# Patient Record
Sex: Female | Born: 1980 | Race: Black or African American | Hispanic: No | Marital: Single | Smoking: Never smoker
Health system: Southern US, Community
[De-identification: ages and names within clinical notes are randomized; demographics above are authoritative.]

## PROBLEM LIST (undated history)

## (undated) DIAGNOSIS — A749 Chlamydial infection, unspecified: Secondary | ICD-10-CM

## (undated) DIAGNOSIS — I429 Cardiomyopathy, unspecified: Secondary | ICD-10-CM

## (undated) HISTORY — DX: Chlamydial infection, unspecified: A74.9

## (undated) HISTORY — PX: OTHER SURGICAL HISTORY: SHX169

---

## 2003-08-13 ENCOUNTER — Other Ambulatory Visit: Admission: RE | Admit: 2003-08-13 | Discharge: 2003-08-13 | Payer: Self-pay | Admitting: Obstetrics and Gynecology

## 2004-08-21 ENCOUNTER — Other Ambulatory Visit: Admission: RE | Admit: 2004-08-21 | Discharge: 2004-08-21 | Payer: Self-pay | Admitting: Obstetrics and Gynecology

## 2005-06-19 ENCOUNTER — Inpatient Hospital Stay (HOSPITAL_COMMUNITY): Admission: AD | Admit: 2005-06-19 | Discharge: 2005-06-21 | Payer: Self-pay | Admitting: Obstetrics and Gynecology

## 2005-06-22 ENCOUNTER — Encounter: Admission: RE | Admit: 2005-06-22 | Discharge: 2005-07-22 | Payer: Self-pay | Admitting: Obstetrics and Gynecology

## 2005-08-31 ENCOUNTER — Other Ambulatory Visit: Admission: RE | Admit: 2005-08-31 | Discharge: 2005-08-31 | Payer: Self-pay | Admitting: Obstetrics and Gynecology

## 2006-07-30 ENCOUNTER — Emergency Department (HOSPITAL_COMMUNITY): Admission: EM | Admit: 2006-07-30 | Discharge: 2006-07-30 | Payer: Self-pay | Admitting: Emergency Medicine

## 2010-05-05 LAB — RPR: RPR: NONREACTIVE

## 2010-05-05 LAB — RUBELLA ANTIBODY, IGM: Rubella: IMMUNE

## 2010-05-30 NOTE — H&P (Signed)
[name], [name]       ACCOUNT NO.:  [id number]   MEDICAL RECORD NO.:  [id number]          PATIENT TYPE:  MAT   LOCATION:  [facility]                          FACILITY:  [facility]   PHYSICIAN:  Crist Fat. Rivard, M.D. DATE OF BIRTH:  [birth year]   DATE OF ADMISSION:  06/19/2005  DATE OF DISCHARGE:                                HISTORY & PHYSICAL   HISTORY OF PRESENT ILLNESS:  Ms. [name] is a 30 year old gravida 1,  para 0 at 38-3/7 weeks, who presented with gradually increasing contractions  since approximately 8 p.m.  She denied any leaking or bleeding and reported  positive fetal movement.  She was seen in the office on June 18, 2005 with  cervix 2 cm, 80%, vertex at a -1.  Membranes were swept at that time.  Pregnancy has been remarkable for:  #1 - Conception on oral contraceptives,  #2 - asthma, #3 - bilateral choroid plexus cysts on 18-week ultrasound; the  patient is unsure if they resolved.   PRENATAL LABORATORY DATA:  Blood type is B positive, Rh-antibody negative.  VDRL nonreactive.  Rubella titer positive.  Hepatitis B surface antigen  negative.  HIV nonreactive.  Sickle cell test was negative.  GC and  Chlamydia cultures were declined at the first visit.  Group B strep culture  was negative at 36 weeks.  Hemoglobin upon entry into the practice was 13.2;  it was 10.8 at 26 weeks.  Quadruple screen was normal.  Glucola was normal.  RPR was nonreactive at 27 weeks.  EDC of July 02, 2005 was established by  last menstrual period and was in agreement with ultrasound at approximately  7 and 18 weeks.   HISTORY OF PRESENT PREGNANCY:  The patient entered care at approximately 7  weeks.  She had an ultrasound for dating secondary to conception on OCPs.  She had another ultrasound at 14 weeks showing normal growth and development  and a normal nuchal translucency.  She had another ultrasound at 18 weeks  for anatomy; this documented normal cervix and anterior placenta.   There  were bilateral choroid plexus cysts noted.  The patient declined  amniocentesis, but desire quadruple screen; this was done and was normal.  She had a normal Glucola.  Hemoglobin was 10.8 at 26 weeks.  She had had  some low weight gain, and so she had another ultrasound in April that showed  normal growth and development.  The patient is unsure if the choroid plexus  cysts were still present.  The rest of her pregnancy was uncomplicated.   OBSTETRICAL HISTORY:  The patient is a primigravida.   MEDICAL HISTORY:  She was on Ortho Tri-Cyclen and did conceive on this.  She  has occasional yeast infection.  She reports the usual childhood illnesses.  She does have a history of asthma for which she uses a p.r.n. albuterol  inhaler, but has had no attacks.   PAST SURGICAL HISTORY:  Surgical history includes a cyst removed from her  right ear in 2000.  She did have some difficulty going to sleep during that  surgery.   ALLERGIES:  The patient has no known  medication allergies.   FAMILY HISTORY:  Her maternal grandmother had a heart attack.  Her maternal  grandmother was and brother is hypertensive, on medication.  Her mother had  leukemia.  Maternal grandmother was diabetic, on insulin, and is now  deceased.  Paternal grandmother had breast cancer and is now deceased.  Her  paternal grandfather and maternal grandfather did use alcohol.   GENETIC HISTORY:  Genetic history is remarkable for the father of the baby  born with 2 left middle toes connected.  The patient's maternal first cousin  had a hole in the heart.  Father of the baby's mother had sickle cell trait.  Father of the baby's maternal aunt had sickle cell disease.  The patient's  brother and sister are twins.   SOCIAL HISTORY:  The patient is married to the father of the baby; he is  involved and supportive; his name is Interior and spatial designer.  The patient is [place]  American; she is of the [employer] faith.  The patient  has a college  education; she is a Runner, broadcasting/film/video.  Her husband is also college-educated; he is a  Geophysical data processor.  She has been followed by the physicians' service  at [facility].  She denies any alcohol, drug or tobacco use during  this pregnancy.   PHYSICAL EXAMINATION:  VITAL SIGNS:  Stable.  The patient is afebrile.  HEENT:  Within normal limits.  LUNGS:  Bilateral breath sounds are clear.  HEART:  Regular rate and rhythm without murmur.  BREASTS:  Soft and nontender.  ABDOMEN:  Fundal height is approximately 37 cm.  Estimated fetal weight is 6-  1/2 to 7 pounds.  Uterine contractions are every 4-5 minutes, moderate-to-  strong quality.  Fetal heart rate is reassuring, but there are occasional  mild variables noted.  PELVIC:  Cervix is 3 cm, 80%, vertex at a -1 with bulging bag of water.  EXTREMITIES:  Deep tendon reflexes are 2+ without clonus.  There is a trace  edema noted.   IMPRESSION:  1.  Intrauterine pregnancy at 38-3/7 weeks.  2.  Early labor.  3.  Negative group B streptococcus.   PLAN:  1.  Admit to birthing suite per consult with Dr. Estanislado Pandy as attending      physician.  2.  Routine physician orders.  3.  The patient desires epidural as labor progresses.      [name], C.N.M.      Crist Fat Rivard, M.D.  Electronically Signed    VLL/MEDQ  D:  06/19/2005  T:  06/19/2005  Job:  [id number]

## 2010-06-02 LAB — ABO/RH: RH Type: POSITIVE

## 2010-06-02 LAB — RPR: RPR: NONREACTIVE

## 2010-06-02 LAB — HEPATITIS B SURFACE ANTIGEN: Hepatitis B Surface Ag: NEGATIVE

## 2010-10-30 LAB — STREP B DNA PROBE: GBS: NEGATIVE

## 2010-11-15 ENCOUNTER — Inpatient Hospital Stay (HOSPITAL_COMMUNITY)
Admission: AD | Admit: 2010-11-15 | Discharge: 2010-11-15 | Disposition: A | Discharge status: Home / Self Care | Payer: BC Managed Care – PPO | Source: Ambulatory Visit | Attending: Obstetrics and Gynecology | Admitting: Obstetrics and Gynecology

## 2010-11-15 ENCOUNTER — Encounter (HOSPITAL_COMMUNITY): Payer: Self-pay | Admitting: *Deleted

## 2010-11-15 DIAGNOSIS — O471 False labor at or after 37 completed weeks of gestation: Secondary | ICD-10-CM

## 2010-11-15 NOTE — ED Provider Notes (Signed)
History     Chief Complaint  Patient presents with  . Labor Eval   HPI 30yo G2 P1 who presents at 74w 3d gestation with c/o uterine contractions with increased frequency and intensity.  Reports onset at 3:30 AM 11/15/10.  Denies ROM or bldg.  Reports active fetus.  Reports UCs approx every since 5:30am and unable to walk and talk through.  States she has lost her mucus plug this AM.    Pregnancy remarkable for: Followed by MD service of [facility] 1st trim chlamydia and BV with neg TOC Hx prev pyelonephritis x 1 prior to preg.  OB History    Grav Para Term Preterm Abortions TAB SAB Ect Mult Living   2 1 1       1       Past Medical History  Diagnosis Date  . Asthma     Past Surgical History  Procedure Date  . Cyst removal rt ear     Family History  Problem Relation Age of Onset  . Hypertension Mother   . Cancer Mother   . Hypertension Brother   . Heart disease Maternal Grandmother   . Hypertension Maternal Grandmother   . Cancer Paternal Grandmother   . Diabetes Paternal Grandmother     History  Substance Use Topics  . Smoking status: Never Smoker   . Smokeless tobacco: Not on file  . Alcohol Use: No    Allergies: No Known Allergies  Prescriptions prior to admission  Medication Sig Dispense Refill  . albuterol (PROVENTIL HFA) 108 (90 BASE) MCG/ACT inhaler Inhale 2 puffs into the lungs every 6 (six) hours as needed.        . Prenatal Vit-Fe Fumarate-FA (PRENATAL MULTIVITAMIN) 60-1 MG tablet Take 1 tablet by mouth daily.          Review of Systems  Constitutional: Negative.   HENT: Negative.   Eyes: Negative.   Respiratory: Negative.   Cardiovascular: Negative.   Gastrointestinal: Negative.   Genitourinary: Negative.   Musculoskeletal: Negative.   Skin: Negative.   Neurological: Negative.   Endo/Heme/Allergies: Negative.   Psychiatric/Behavioral: Negative.    Physical Exam   Blood pressure 113/73, pulse 80, temperature 98 F (36.7 C), temperature  source Oral, resp. rate 18, height 5\' 6"  (1.676 m), weight 91.899 kg (202 lb 9.6 oz), last menstrual period 02/18/2010.  Physical Exam  Constitutional: She is oriented to person, place, and time. She appears well-developed and well-nourished.  HENT:  Head: Normocephalic and atraumatic.  Right Ear: External ear normal.  Left Ear: External ear normal.  Nose: Nose normal.  Eyes: Conjunctivae are normal. Pupils are equal, round, and reactive to light.  Neck: Normal range of motion. Neck supple. No thyromegaly present.  Cardiovascular: Normal rate, regular rhythm and intact distal pulses.   Respiratory: Effort normal and breath sounds normal.  GI: Soft. Bowel sounds are normal.       Gravid.  Fundal height 38cms.  Vtx to leopolds.  EFW 6.5#.  Genitourinary: Vagina normal and uterus normal.       SVE:  3cm/70%/-2/vtx.  Sm amt of mucus present.  Musculoskeletal: Normal range of motion.  Neurological: She is alert and oriented to person, place, and time. She has normal reflexes.  Skin: Skin is warm and dry.  Psychiatric: She has a normal mood and affect. Her behavior is normal. Judgment and thought content normal.   FHR 135 baseline with moderate variability present.  Accels present.  No decels present.  FHR Cat 1  Toco:  UCs every 5-6 minutes and mod to palpation.  Pt talking through UCs. Marland Kitchen MAU Course  Procedures   Assessment and Plan  IUP at 38w 3d Uterine contractions  Pt to ambulate and will re-eval uterine contraction pattern and SVE after ambulation.  Kean Gautreau O. 11/15/10, 8:38 AM   S:  Pt returned from ambulation.  Reports since returning from walking, UCs are less frequent and less intense than earlier.  Denies ROM or bldg.  Reports normal fetal movement.  O: Pt smiling and laughing.  No change in PE.      FHR: Baseline 140.  Moderate variability present.  Accels present.  No decels present.  FHR Cat 1.      Toco:  UCs every 5-7 minutes and mild to palpation.      SVE:   3cm/70%/-2/vtx - unchanged from previous exam.     A:  IUP at 38w 3d      False labor.  P:  Pt d/ced to home after consult with Dr. Stefano Gaul.      Rec pt try OTC benadryl for therapeutic rest and increase fluids.      Rev'd signs/symptoms of labor and fetal kick counts.      RTO at [facility] on Thursday, November 20, 2010 as previously scheduled.  Elsie Ra, CNM 11/15/10 10:50 AM

## 2010-11-15 NOTE — ED Notes (Signed)
efm strip reviewed by Elsie Ra CNM, patient will walk for an hour and then be rechecked, plan of care discussed with patient and her significant other by Elsie Ra CNM

## 2010-11-15 NOTE — Progress Notes (Signed)
Pt reports having ctx  sonce 3am about 6 min apart. Reports mucusy discharge and deneis vag bleeding. eports good fetal movement.

## 2010-11-18 ENCOUNTER — Other Ambulatory Visit: Payer: Self-pay | Admitting: Obstetrics and Gynecology

## 2010-11-18 ENCOUNTER — Inpatient Hospital Stay (HOSPITAL_COMMUNITY): Payer: BC Managed Care – PPO | Admitting: Anesthesiology

## 2010-11-18 ENCOUNTER — Encounter (HOSPITAL_COMMUNITY): Payer: Self-pay | Admitting: Anesthesiology

## 2010-11-18 ENCOUNTER — Inpatient Hospital Stay (HOSPITAL_COMMUNITY)
Admission: AD | Admit: 2010-11-18 | Discharge: 2010-11-20 | DRG: 373 | Disposition: A | Discharge status: Home / Self Care | Payer: BC Managed Care – PPO | Source: Ambulatory Visit | Attending: Obstetrics and Gynecology | Admitting: Obstetrics and Gynecology

## 2010-11-18 ENCOUNTER — Encounter (HOSPITAL_COMMUNITY): Payer: Self-pay | Admitting: *Deleted

## 2010-11-18 DIAGNOSIS — IMO0001 Reserved for inherently not codable concepts without codable children: Secondary | ICD-10-CM

## 2010-11-18 DIAGNOSIS — Z3490 Encounter for supervision of normal pregnancy, unspecified, unspecified trimester: Secondary | ICD-10-CM

## 2010-11-18 DIAGNOSIS — J45909 Unspecified asthma, uncomplicated: Secondary | ICD-10-CM | POA: Diagnosis present

## 2010-11-18 LAB — CBC
HCT: 38.6 % (ref 36.0–46.0)
MCHC: 33.2 g/dL (ref 30.0–36.0)
MCV: 94.8 fL (ref 78.0–100.0)
Platelets: 294 10*3/uL (ref 150–400)
RDW: 12.7 % (ref 11.5–15.5)
WBC: 10.5 10*3/uL (ref 4.0–10.5)

## 2010-11-18 MED ORDER — PHENYLEPHRINE 40 MCG/ML (10ML) SYRINGE FOR IV PUSH (FOR BLOOD PRESSURE SUPPORT)
80.0000 ug | PREFILLED_SYRINGE | INTRAVENOUS | Status: DC | PRN
  Filled 2010-11-18: qty 5

## 2010-11-18 MED ORDER — LIDOCAINE HCL (PF) 1 % IJ SOLN
30.0000 mL | INTRAMUSCULAR | Status: DC | PRN
  Filled 2010-11-18: qty 30

## 2010-11-18 MED ORDER — FLEET ENEMA 7-19 GM/118ML RE ENEM
1.0000 | ENEMA | RECTAL | Status: DC | PRN
Start: 2010-11-18 — End: 2010-11-20

## 2010-11-18 MED ORDER — BISACODYL 10 MG RE SUPP: 10.0000 mg | Freq: Every day | RECTAL | Status: DC | PRN

## 2010-11-18 MED ORDER — ONDANSETRON HCL 4 MG/2ML IJ SOLN: 4.0000 mg | INTRAMUSCULAR | Status: DC | PRN

## 2010-11-18 MED ORDER — EPHEDRINE 5 MG/ML INJ
10.0000 mg | INTRAVENOUS | Status: DC | PRN
  Filled 2010-11-18: qty 4

## 2010-11-18 MED ORDER — ALBUTEROL SULFATE HFA 108 (90 BASE) MCG/ACT IN AERS
2.0000 | INHALATION_SPRAY | Freq: Four times a day (QID) | RESPIRATORY_TRACT | Status: DC | PRN
  Filled 2010-11-18: qty 6.7

## 2010-11-18 MED ORDER — TETANUS-DIPHTH-ACELL PERTUSSIS 5-2.5-18.5 LF-MCG/0.5 IM SUSP: 0.5000 mL | Freq: Once | INTRAMUSCULAR | Status: DC

## 2010-11-18 MED ORDER — DIBUCAINE 1 % RE OINT: 1.0000 "application " | TOPICAL_OINTMENT | RECTAL | Status: DC | PRN

## 2010-11-18 MED ORDER — DIPHENHYDRAMINE HCL 25 MG PO CAPS: 25.0000 mg | ORAL_CAPSULE | Freq: Four times a day (QID) | ORAL | Status: DC | PRN

## 2010-11-18 MED ORDER — IBUPROFEN 600 MG PO TABS
600.0000 mg | ORAL_TABLET | Freq: Four times a day (QID) | ORAL | Status: DC
  Administered 2010-11-18 – 2010-11-20 (×7): 600 mg via ORAL
  Filled 2010-11-18 (×7): qty 1

## 2010-11-18 MED ORDER — LACTATED RINGERS IV SOLN: 500.0000 mL | INTRAVENOUS | Status: DC | PRN

## 2010-11-18 MED ORDER — ACETAMINOPHEN 325 MG PO TABS: 650.0000 mg | ORAL_TABLET | ORAL | Status: DC | PRN

## 2010-11-18 MED ORDER — EPHEDRINE 5 MG/ML INJ: 10.0000 mg | INTRAVENOUS | Status: DC | PRN

## 2010-11-18 MED ORDER — BENZOCAINE-MENTHOL 20-0.5 % EX AERO
1.0000 "application " | INHALATION_SPRAY | CUTANEOUS | Status: DC | PRN
  Administered 2010-11-18: 1 via TOPICAL

## 2010-11-18 MED ORDER — FLEET ENEMA 7-19 GM/118ML RE ENEM: 1.0000 | ENEMA | RECTAL | Status: DC | PRN

## 2010-11-18 MED ORDER — ONDANSETRON HCL 4 MG/2ML IJ SOLN: 4.0000 mg | Freq: Four times a day (QID) | INTRAMUSCULAR | Status: DC | PRN

## 2010-11-18 MED ORDER — SENNOSIDES-DOCUSATE SODIUM 8.6-50 MG PO TABS
2.0000 | ORAL_TABLET | Freq: Every day | ORAL | Status: DC
  Administered 2010-11-18 – 2010-11-19 (×2): 2 via ORAL

## 2010-11-18 MED ORDER — OXYTOCIN 20 UNITS IN LACTATED RINGERS INFUSION - SIMPLE
125.0000 mL/h | Freq: Once | INTRAVENOUS | Status: AC
  Administered 2010-11-18: 999 mL/h via INTRAVENOUS

## 2010-11-18 MED ORDER — LANOLIN HYDROUS EX OINT: TOPICAL_OINTMENT | CUTANEOUS | Status: DC | PRN

## 2010-11-18 MED ORDER — FENTANYL 2.5 MCG/ML BUPIVACAINE 1/10 % EPIDURAL INFUSION (WH - ANES)
14.0000 mL/h | INTRAMUSCULAR | Status: DC
  Administered 2010-11-18 (×2): 14 mL/h via EPIDURAL
  Filled 2010-11-18 (×2): qty 60

## 2010-11-18 MED ORDER — LACTATED RINGERS IV SOLN
INTRAVENOUS | Status: DC
  Administered 2010-11-18: 01:00:00 via INTRAVENOUS

## 2010-11-18 MED ORDER — LACTATED RINGERS IV SOLN: INTRAVENOUS | Status: DC

## 2010-11-18 MED ORDER — TERBUTALINE SULFATE 1 MG/ML IJ SOLN: 0.2500 mg | Freq: Once | INTRAMUSCULAR | Status: DC | PRN

## 2010-11-18 MED ORDER — BUTORPHANOL TARTRATE 2 MG/ML IJ SOLN: 1.0000 mg | INTRAMUSCULAR | Status: DC | PRN

## 2010-11-18 MED ORDER — HYDROXYZINE HCL 50 MG PO TABS: 50.0000 mg | ORAL_TABLET | Freq: Four times a day (QID) | ORAL | Status: DC | PRN

## 2010-11-18 MED ORDER — OXYCODONE-ACETAMINOPHEN 5-325 MG PO TABS
2.0000 | ORAL_TABLET | ORAL | Status: DC | PRN
  Administered 2010-11-18: 2 via ORAL
  Filled 2010-11-18: qty 2

## 2010-11-18 MED ORDER — ONDANSETRON HCL 4 MG PO TABS: 4.0000 mg | ORAL_TABLET | ORAL | Status: DC | PRN

## 2010-11-18 MED ORDER — OXYCODONE-ACETAMINOPHEN 5-325 MG PO TABS
1.0000 | ORAL_TABLET | ORAL | Status: DC | PRN
  Administered 2010-11-18 – 2010-11-20 (×8): 2 via ORAL
  Filled 2010-11-18 (×2): qty 2
  Filled 2010-11-18: qty 1
  Filled 2010-11-18 (×5): qty 2
  Filled 2010-11-18: qty 1

## 2010-11-18 MED ORDER — LACTATED RINGERS IV SOLN: 500.0000 mL | Freq: Once | INTRAVENOUS | Status: DC

## 2010-11-18 MED ORDER — PRENATAL PLUS 27-1 MG PO TABS
1.0000 | ORAL_TABLET | Freq: Every day | ORAL | Status: DC
  Administered 2010-11-18 – 2010-11-20 (×3): 1 via ORAL
  Filled 2010-11-18 (×3): qty 1

## 2010-11-18 MED ORDER — PHENYLEPHRINE 40 MCG/ML (10ML) SYRINGE FOR IV PUSH (FOR BLOOD PRESSURE SUPPORT): 80.0000 ug | PREFILLED_SYRINGE | INTRAVENOUS | Status: DC | PRN

## 2010-11-18 MED ORDER — MEDROXYPROGESTERONE ACETATE 150 MG/ML IM SUSP: 150.0000 mg | INTRAMUSCULAR | Status: DC | PRN

## 2010-11-18 MED ORDER — LIDOCAINE HCL 1.5 % IJ SOLN
INTRAMUSCULAR | Status: DC | PRN
  Administered 2010-11-18 (×2): 5 mL via EPIDURAL

## 2010-11-18 MED ORDER — CITRIC ACID-SODIUM CITRATE 334-500 MG/5ML PO SOLN: 30.0000 mL | ORAL | Status: DC | PRN

## 2010-11-18 MED ORDER — ZOLPIDEM TARTRATE 5 MG PO TABS: 5.0000 mg | ORAL_TABLET | Freq: Every evening | ORAL | Status: DC | PRN

## 2010-11-18 MED ORDER — OXYTOCIN BOLUS FROM INFUSION
500.0000 mL | Freq: Once | INTRAVENOUS | Status: DC
  Filled 2010-11-18: qty 500
  Filled 2010-11-18: qty 1000

## 2010-11-18 MED ORDER — SIMETHICONE 80 MG PO CHEW: 80.0000 mg | CHEWABLE_TABLET | ORAL | Status: DC | PRN

## 2010-11-18 MED ORDER — FERROUS SULFATE 325 (65 FE) MG PO TABS
325.0000 mg | ORAL_TABLET | Freq: Two times a day (BID) | ORAL | Status: DC
  Administered 2010-11-18 – 2010-11-19 (×2): 325 mg via ORAL
  Filled 2010-11-18 (×4): qty 1

## 2010-11-18 MED ORDER — HYDROXYZINE HCL 50 MG/ML IM SOLN: 50.0000 mg | Freq: Four times a day (QID) | INTRAMUSCULAR | Status: DC | PRN

## 2010-11-18 MED ORDER — BENZOCAINE-MENTHOL 20-0.5 % EX AERO
INHALATION_SPRAY | CUTANEOUS | Status: AC
  Administered 2010-11-18: 1 via TOPICAL
  Filled 2010-11-18: qty 56

## 2010-11-18 MED ORDER — DIPHENHYDRAMINE HCL 50 MG/ML IJ SOLN: 12.5000 mg | INTRAMUSCULAR | Status: DC | PRN

## 2010-11-18 MED ORDER — WITCH HAZEL-GLYCERIN EX PADS: 1.0000 "application " | MEDICATED_PAD | CUTANEOUS | Status: DC | PRN

## 2010-11-18 MED ORDER — OXYTOCIN 20 UNITS IN LACTATED RINGERS INFUSION - SIMPLE: 1.0000 m[IU]/min | INTRAVENOUS | Status: DC

## 2010-11-18 MED ORDER — IBUPROFEN 600 MG PO TABS
600.0000 mg | ORAL_TABLET | Freq: Four times a day (QID) | ORAL | Status: DC | PRN
  Administered 2010-11-18: 600 mg via ORAL
  Filled 2010-11-18: qty 1

## 2010-11-18 NOTE — Anesthesia Preprocedure Evaluation (Signed)
Anesthesia Evaluation  Patient identified by MRN, date of birth, ID band Patient awake    Reviewed: Allergy & Precautions, H&P , Patient's Chart, lab work & pertinent test results  Airway Mallampati: II TM Distance: >3 FB Neck ROM: full    Dental No notable dental hx.    Pulmonary neg pulmonary ROS, asthma ,  clear to auscultation  Pulmonary exam normal       Cardiovascular neg cardio ROS regular Normal    Neuro/Psych Negative Neurological ROS  Negative Psych ROS   GI/Hepatic negative GI ROS, Neg liver ROS,   Endo/Other  Negative Endocrine ROS  Renal/GU negative Renal ROS     Musculoskeletal   Abdominal   Peds  Hematology negative hematology ROS (+)   Anesthesia Other Findings   Reproductive/Obstetrics (+) Pregnancy                           Anesthesia Physical Anesthesia Plan  ASA: II  Anesthesia Plan: Epidural   Post-op Pain Management:    Induction:   Airway Management Planned:   Additional Equipment:   Intra-op Plan:   Post-operative Plan:   Informed Consent: I have reviewed the patients History and Physical, chart, labs and discussed the procedure including the risks, benefits and alternatives for the proposed anesthesia with the patient or authorized representative who has indicated his/her understanding and acceptance.     Plan Discussed with:   Anesthesia Plan Comments:         Anesthesia Quick Evaluation  

## 2010-11-18 NOTE — Progress Notes (Signed)
Delivery Note At 10:32 AM a viable female was delivered via Vaginal, Spontaneous Delivery (Presentation: Left Occiput Anterior).  APGAR: 9, 9; weight 7 lb 3 oz (3260 g).   Placenta status: Abnormal, Manual removal.  Cord: 3 vessels with the following complications: None.  Cord pH: na  Anesthesia: Epidural  Episiotomy: None Lacerations: None Suture Repair: na Est. Blood Loss (mL): 500  Mom to postpartum.  Baby to nursery-stable.  Rook Maue P 11/18/2010, 11:14 AM

## 2010-11-18 NOTE — Progress Notes (Signed)
FHR decreased to 80-90 bpm for 2 minutes then gradually returned to baseline.  Dr. Pennie Rushing notified.

## 2010-11-18 NOTE — Progress Notes (Signed)
Contractions x 4 hours. Denies bleeding or ROM

## 2010-11-18 NOTE — Anesthesia Procedure Notes (Signed)
Epidural Patient location during procedure: OB Start time: 11/18/2010 1:59 AM  Staffing Performed by: anesthesiologist   Preanesthetic Checklist Completed: patient identified, site marked, surgical consent, pre-op evaluation, timeout performed, IV checked, risks and benefits discussed and monitors and equipment checked  Epidural Patient position: sitting Prep: site prepped and draped and DuraPrep Patient monitoring: continuous pulse ox and blood pressure Approach: midline Injection technique: LOR air and LOR saline  Needle:  Needle type: Tuohy  Needle gauge: 17 G Needle length: 9 cm Needle insertion depth: 6 cm Catheter type: closed end flexible Catheter size: 19 Gauge Catheter at skin depth: 11 cm Test dose: negative  Assessment Events: blood not aspirated, injection not painful, no injection resistance, negative IV test and no paresthesia  Additional Notes Patient identified.  Risk benefits discussed including failed block, incomplete pain control, headache, nerve damage, paralysis, blood pressure changes, nausea, vomiting, reactions to medication both toxic or allergic, and postpartum back pain.  Patient expressed understanding and wished to proceed.  All questions were answered.  Sterile technique used throughout procedure and epidural site dressed with sterile barrier dressing. No paresthesia or other complications noted.The patient did not experience any signs of intravascular injection such as tinnitus or metallic taste in mouth nor signs of intrathecal spread such as rapid motor block. Please see nursing notes for vital signs.

## 2010-11-18 NOTE — Progress Notes (Signed)
[name] is a 30 y.o. G2P1001 at [redacted]w[redacted]d by LMP admitted for active labor  Subjective:  Comfortable with epidural now.   Objective: BP 123/63  Pulse 91  Temp(Src) 98.1 F (36.7 C) (Oral)  Resp 18  Ht 5\' 6"  (1.676 m)  Wt 207 lb (93.895 kg)  BMI 33.41 kg/m2  SpO2 80%  LMP 02/18/2010      FHT:  FHR: 145 bpm, variability: minimal ,  accelerations:  Present,  decelerations:  Present single episode at about 818 am with FHR decel to the 80's  for 2 mins after position changed.  Position changed again with resolution fo the decel and none since UC:   irregular, every 3-5 minutes SVE:   cx by my exam 6cm/90%/-2  Labs: Lab Results  Component Value Date   WBC 10.5 11/18/2010   HGB 12.8 11/18/2010   HCT 38.6 11/18/2010   MCV 94.8 11/18/2010   PLT 294 11/18/2010    Assessment / Plan: Arrest in active phase of labor  Labor: contractions more irregular after epidural.   AROM with clear fluid Preeclampsia:  no signs or symptoms of toxicity Fetal Wellbeing:  Category II Pain Control:  Epidural I/D:  n/a Anticipated MOD:  NSVD and will start pitocin if no cervical progress in one hour  Gregory,Leah P 11/18/2010, 8:50 AM

## 2010-11-18 NOTE — H&P (Signed)
[name] is a 30 y.o. black female G2P1001 presenting at 38.6 weeks per [facility] 11/26/10 for labor check with contractions increasing in frequency and intensity since 2000. Had tried warm bath without relief and was unable to sleep.  Denies any LOF, VB, UTI or PIH s/s.  GFM.  3cm in MAU on Saturday, but ctxs spaced and d/c'd home.  Followed by MD service at [facility]; onset of prenatal care at 10.5 weeks.  Declined aneuploidy screening.  First pregnancy was SVD in 2007, female "Micah," weighing 5lb 10oz at 38 weeks.  Expecting a boy this pregnancy.   Maternal Medical History:  Reason for admission: Reason for admission: contractions.  Contractions: Onset was 3-5 hours ago.   Frequency: regular.   Perceived severity is moderate.    Fetal activity: Perceived fetal activity is normal.   Last perceived fetal movement was within the past hour.    Prenatal complications: 1.  1st trimester chlamydia and BV 2.  Asthma 3.  H/o pyelonephritis in the past 4.  Different FOB this pregnancy    OB History    Grav Para Term Preterm Abortions TAB SAB Ect Mult Living   2 1 1       1      Past Medical History  Diagnosis Date  . Asthma    Past Surgical History  Procedure Date  . Cyst removal rt ear    Family History: family history includes Cancer in her mother and paternal grandmother; Diabetes in her paternal grandmother; Heart disease in her maternal grandmother; and Hypertension in her brother, maternal grandmother, and mother. Social History:  reports that she has never smoked. She does not have any smokeless tobacco history on file. She reports that she does not drink alcohol or use illicit drugs.  Review of Systems  Constitutional: Negative.   Eyes: Negative.   Respiratory: Negative.   Cardiovascular: Negative.   Gastrointestinal: Negative.        2 BM's today  Genitourinary: Negative.   Skin: Negative.   Neurological: Negative.     Dilation: 4.5 Effacement (%): 80 Exam by::  Denvil Canning,.CNM Blood pressure 122/67, pulse 79, temperature 98.5 F (36.9 C), resp. rate 20, height 5\' 6"  (1.676 m), weight 93.895 kg (207 lb), last menstrual period 02/18/2010, SpO2 99.00%. Maternal Exam:  Uterine Assessment: Contraction strength is moderate.  Contraction frequency is regular.  UC's q 2-5  Abdomen: Patient reports no abdominal tenderness. Estimated fetal weight is 6-7 lbs.   Fetal presentation: vertex  Introitus: Normal vulva. Pelvis: adequate for delivery.   Cervix: Cervix evaluated by digital exam.     Fetal Exam Fetal Monitor Review: Mode: ultrasound.   Baseline rate: 140.  Variability: moderate (6-25 bpm).   Pattern: no accelerations and no decelerations.    Fetal State Assessment: Category I - tracings are normal.     Physical Exam  Constitutional: She is oriented to person, place, and time. She appears well-developed and well-nourished. No distress.  Cardiovascular: Normal rate and regular rhythm.   Respiratory: Effort normal and breath sounds normal.  GI: Soft. Bowel sounds are normal.       gravid  Genitourinary:       Cx: 5/80/-1 posterior to mid position, BBOW,   Musculoskeletal: She exhibits edema.       Trace to mild BLE generalized edema  Neurological: She is alert and oriented to person, place, and time. She has normal reflexes.  Skin: Skin is warm and dry.  Psychiatric: She has a normal mood  and affect. Her behavior is normal. Judgment and thought content normal.    Prenatal labs: ABO, Rh: B/Positive/-- (05/21 0000) Antibody: Negative (05/21 0000) Rubella: Immune (04/23 0000) RPR: Nonreactive (05/21 0000)  HBsAg: Negative (05/21 0000)  HIV: Non-reactive (05/21 0000)  GBS: Negative (10/18 0000)  1hr gtt=131  Assessment/Plan: 1.  IUP at 38.6 weeks 2.  Active labor 3.  GBS negative 4.  Desires epidural 5.  Planning to BF and circumcision 6.  Cat 1 FHT; nonreactive, but negative spontaneous CST 7.  Stable asthma  1.  Admit to BS  with Dr. Estanislado Pandy as attending 2.  Routine L&D orders 3.  Epidural ASAP 4.  AROM and/or Pitocin prn augmentation 5.  MD to follow   Quantarius Genrich H 11/18/2010, 1:07 AM

## 2010-11-18 NOTE — Progress Notes (Signed)
Dr. Pennie Rushing notified of pt status, SVE, FHR, deceleration noted, RN interventions, and UC pattern.  Will continue to monitor.

## 2010-11-19 LAB — CBC
Hemoglobin: 10.9 g/dL — ABNORMAL LOW (ref 12.0–15.0)
MCH: 31 pg (ref 26.0–34.0)
RBC: 3.52 MIL/uL — ABNORMAL LOW (ref 3.87–5.11)
WBC: 10.3 10*3/uL (ref 4.0–10.5)

## 2010-11-19 NOTE — Anesthesia Postprocedure Evaluation (Signed)
Anesthesia Post Note  Patient: [name]  Procedure(s) Performed: * No procedures listed *  Anesthesia type: Epidural  Patient location: Mother/Baby  Post pain: Pain level controlled  Post assessment: Post-op Vital signs reviewed  Last Vitals:  Filed Vitals:   11/19/10 2132  BP: 106/65  Pulse: 89  Temp: 36.7 C  Resp: 18    Post vital signs: Reviewed  Level of consciousness: awake  Complications: No apparent anesthesia complications

## 2010-11-19 NOTE — Progress Notes (Addendum)
  Post Partum Day 1 Subjective: C/o nausea currently, had taken percocet, PNV and Fe pill simultaneously, offered zofran, pt declined at this time, up ad lib without syncope, voiding, tolerating PO, + flatus  Pain well controlled with po meds BF started Mood stable, bonding well   Objective: Blood pressure 112/77, pulse 74, temperature 97.8 F (36.6 C), temperature source Oral, resp. rate 18, height 5\' 6"  (1.676 m), weight 93.895 kg (207 lb), last menstrual period 02/18/2010, SpO2 80.00%, unknown if currently breastfeeding.  Physical Exam:  General: alert and no distress Lungs: CTAB Heart: RRR Breasts: soft, nipples intact Lochia: appropriate Uterine Fundus: firm Perineum: WNL DVT Evaluation: No evidence of DVT seen on physical exam. Negative Homan's sign.   Basename 11/19/10 0510 11/18/10 0115  HGB 10.9* 12.8  HCT 33.9* 38.6    Assessment/Plan:  Plan for discharge tomorrow, Breastfeeding, Lactation consult and Circumcision prior to discharge      LOS: 1 day   LILLARD,SHELLEY M 11/19/2010, 12:01 PM    Agree with above - AYR

## 2010-11-19 NOTE — Addendum Note (Signed)
Addendum  created 11/19/10 2250 by Velna Hatchet, MD   Modules edited:Notes Section

## 2010-11-20 ENCOUNTER — Encounter (HOSPITAL_COMMUNITY)
Admission: RE | Admit: 2010-11-20 | Discharge: 2010-11-20 | Disposition: A | Discharge status: Home / Self Care | Payer: BC Managed Care – PPO | Source: Ambulatory Visit | Attending: Obstetrics and Gynecology | Admitting: Obstetrics and Gynecology

## 2010-11-20 DIAGNOSIS — O923 Agalactia: Secondary | ICD-10-CM | POA: Insufficient documentation

## 2010-11-20 MED ORDER — OXYCODONE-ACETAMINOPHEN 5-325 MG PO TABS: 1.0000 | ORAL_TABLET | ORAL | Status: AC | PRN

## 2010-11-20 MED ORDER — IBUPROFEN 600 MG PO TABS: 600.0000 mg | ORAL_TABLET | Freq: Four times a day (QID) | ORAL | Status: AC | PRN

## 2010-11-20 NOTE — Discharge Summary (Signed)
  Obstetric Discharge Summary Reason for Admission: onset of labor and rupture of membranes Prenatal Procedures: ultrasound Intrapartum Procedures: spontaneous vaginal delivery Postpartum Procedures: none Complications-Operative and Postpartum: none  Temp:  [97.1 F (36.2 C)-98.1 F (36.7 C)] 97.1 F (36.2 C) (11/08 0655) Pulse Rate:  [89-95] 95  (11/08 0655) Resp:  [18] 18  (11/08 0655) BP: (106-113)/(65-76) 113/74 mmHg (11/08 0655) SpO2:  [100 %] 100 % (11/07 1353) Hemoglobin  Date Value Range Status  11/19/2010 10.9* 12.0-15.0 (g/dL) Final     HCT  Date Value Range Status  11/19/2010 33.9* 36.0-46.0 (%) Final    Hospital Course:  Admitted in labor on 11/18/10 after SROM.  Negative GBS.  Labor progressed well, and delivery was performed by Dr. Pennie Rushing without difficulty. Patient and baby tolerated the procedure without difficulty, with no lacerations noted. Infant to FTN. Mother and infant then had an uncomplicated postpartum course, with breast feeding going well. Mom's physical exam was WNL, and she was discharged home in stable condition. Contraception plan was Mirena.  She received adequate benefit from po pain medications and requested Motrin and Percocet for home use.  Other issues: NA    Discharge Diagnoses: Term Pregnancy-delivered  Discharge Information: Date: 11/20/2010 Activity: Per [facility] handout Diet: routine Medications:  Medication List  As of 11/20/2010  8:41 AM   START taking these medications         ibuprofen 600 MG tablet   Commonly known as: ADVIL,MOTRIN   Take 1 tablet (600 mg total) by mouth every 6 (six) hours as needed for pain.      oxyCODONE-acetaminophen 5-325 MG per tablet   Commonly known as: PERCOCET   Take 1-2 tablets by mouth every 4 (four) hours as needed (moderate - severe pain).         CONTINUE taking these medications         acetaminophen-codeine 300-30 MG per tablet   Commonly known as: TYLENOL #3      prenatal vitamin w/FE,  FA 27-1 MG Tabs      PROVENTIL HFA 108 (90 BASE) MCG/ACT inhaler   Generic drug: albuterol          Where to get your medications    These are the prescriptions that you need to pick up.   You may get these medications from any pharmacy.         ibuprofen 600 MG tablet   oxyCODONE-acetaminophen 5-325 MG per tablet           Condition: stable Instructions: refer to practice specific booklet Discharge to: home Follow-up Information    Follow up with [facility] in 5 weeks.         Newborn Data: Live born  Information for the patient's newborn:  RYANNA, TESCHNER [[id number]]  female ; APGAR 9/9 , ; weight 7+3 ;  Home with mother.  Nigel Bridgeman 11/20/2010, 8:41 AM

## 2010-12-21 ENCOUNTER — Encounter (HOSPITAL_COMMUNITY)
Admission: RE | Admit: 2010-12-21 | Discharge: 2010-12-21 | Disposition: A | Discharge status: Home / Self Care | Payer: BC Managed Care – PPO | Source: Ambulatory Visit | Attending: Obstetrics and Gynecology | Admitting: Obstetrics and Gynecology

## 2010-12-21 DIAGNOSIS — O923 Agalactia: Secondary | ICD-10-CM | POA: Insufficient documentation

## 2011-01-21 ENCOUNTER — Encounter (HOSPITAL_COMMUNITY)
Admission: RE | Admit: 2011-01-21 | Discharge: 2011-01-21 | Disposition: A | Discharge status: Home / Self Care | Payer: BC Managed Care – PPO | Source: Ambulatory Visit | Attending: Obstetrics and Gynecology | Admitting: Obstetrics and Gynecology

## 2011-01-21 DIAGNOSIS — O923 Agalactia: Secondary | ICD-10-CM | POA: Insufficient documentation

## 2011-02-21 ENCOUNTER — Encounter (HOSPITAL_COMMUNITY)
Admission: RE | Admit: 2011-02-21 | Discharge: 2011-02-21 | Disposition: A | Discharge status: Home / Self Care | Payer: BC Managed Care – PPO | Source: Ambulatory Visit | Attending: Obstetrics and Gynecology | Admitting: Obstetrics and Gynecology

## 2011-02-21 DIAGNOSIS — O923 Agalactia: Secondary | ICD-10-CM | POA: Insufficient documentation

## 2011-03-23 ENCOUNTER — Encounter (HOSPITAL_COMMUNITY)
Admission: RE | Admit: 2011-03-23 | Discharge: 2011-03-23 | Disposition: A | Discharge status: Home / Self Care | Payer: BC Managed Care – PPO | Source: Ambulatory Visit | Attending: Obstetrics and Gynecology | Admitting: Obstetrics and Gynecology

## 2011-03-23 DIAGNOSIS — O923 Agalactia: Secondary | ICD-10-CM | POA: Insufficient documentation

## 2011-04-23 ENCOUNTER — Encounter (HOSPITAL_COMMUNITY)
Admission: RE | Admit: 2011-04-23 | Discharge: 2011-04-23 | Disposition: A | Discharge status: Home / Self Care | Payer: BC Managed Care – PPO | Source: Ambulatory Visit | Attending: Obstetrics and Gynecology | Admitting: Obstetrics and Gynecology

## 2011-04-23 DIAGNOSIS — O923 Agalactia: Secondary | ICD-10-CM | POA: Insufficient documentation

## 2011-05-24 ENCOUNTER — Encounter (HOSPITAL_COMMUNITY)
Admission: RE | Admit: 2011-05-24 | Discharge: 2011-05-24 | Disposition: A | Discharge status: Home / Self Care | Payer: BC Managed Care – PPO | Source: Ambulatory Visit | Attending: Obstetrics and Gynecology | Admitting: Obstetrics and Gynecology

## 2011-05-24 DIAGNOSIS — O923 Agalactia: Secondary | ICD-10-CM | POA: Insufficient documentation

## 2011-06-24 ENCOUNTER — Encounter (HOSPITAL_COMMUNITY)
Admission: RE | Admit: 2011-06-24 | Discharge: 2011-06-24 | Disposition: A | Discharge status: Home / Self Care | Payer: BC Managed Care – PPO | Source: Ambulatory Visit | Attending: Obstetrics and Gynecology | Admitting: Obstetrics and Gynecology

## 2011-06-24 DIAGNOSIS — O923 Agalactia: Secondary | ICD-10-CM | POA: Insufficient documentation

## 2011-07-25 ENCOUNTER — Encounter (HOSPITAL_COMMUNITY)
Admission: RE | Admit: 2011-07-25 | Discharge: 2011-07-25 | Disposition: A | Discharge status: Home / Self Care | Payer: BC Managed Care – PPO | Source: Ambulatory Visit | Attending: Obstetrics and Gynecology | Admitting: Obstetrics and Gynecology

## 2011-07-25 DIAGNOSIS — O923 Agalactia: Secondary | ICD-10-CM | POA: Insufficient documentation

## 2011-08-25 ENCOUNTER — Encounter (HOSPITAL_COMMUNITY)
Admission: RE | Admit: 2011-08-25 | Discharge: 2011-08-25 | Disposition: A | Discharge status: Home / Self Care | Payer: BC Managed Care – PPO | Source: Ambulatory Visit | Attending: Obstetrics and Gynecology | Admitting: Obstetrics and Gynecology

## 2011-08-25 DIAGNOSIS — O923 Agalactia: Secondary | ICD-10-CM | POA: Insufficient documentation

## 2011-10-21 ENCOUNTER — Encounter: Payer: Self-pay | Admitting: Obstetrics and Gynecology

## 2011-10-27 ENCOUNTER — Ambulatory Visit (INDEPENDENT_AMBULATORY_CARE_PROVIDER_SITE_OTHER): Payer: BC Managed Care – PPO | Admitting: Obstetrics and Gynecology

## 2011-10-27 ENCOUNTER — Encounter: Payer: Self-pay | Admitting: Obstetrics and Gynecology

## 2011-10-27 VITALS — BP 114/70 | HR 78 | Ht 65.25 in | Wt 192.0 lb

## 2011-10-27 DIAGNOSIS — Z309 Encounter for contraceptive management, unspecified: Secondary | ICD-10-CM

## 2011-10-27 DIAGNOSIS — Z124 Encounter for screening for malignant neoplasm of cervix: Secondary | ICD-10-CM

## 2011-10-27 DIAGNOSIS — Z01419 Encounter for gynecological examination (general) (routine) without abnormal findings: Secondary | ICD-10-CM

## 2011-10-27 LAB — POCT URINE PREGNANCY: Preg Test, Ur: NEGATIVE

## 2011-10-27 MED ORDER — NORGESTIM-ETH ESTRAD TRIPHASIC 0.18/0.215/0.25 MG-25 MCG PO TABS: 1.0000 | ORAL_TABLET | Freq: Every day | ORAL | Status: DC

## 2011-10-27 MED ORDER — ALBUTEROL SULFATE HFA 108 (90 BASE) MCG/ACT IN AERS: 2.0000 | INHALATION_SPRAY | Freq: Four times a day (QID) | RESPIRATORY_TRACT | Status: AC | PRN

## 2011-10-27 NOTE — Progress Notes (Addendum)
Subjective:    [name] is a 31 y.o. female, G2P2002, who presents for an annual exam. The patient reports no problems but is weaning her infant and would like to resume Tri Sprintec.  She also needs a refill on her Albuterol Inhaler (has exercise induced asthma).  Menstrual cycle:   LMP: No LMP recorded. Patient is not currently having periods (Reason: Lactating).             Review of Systems Pertinent items are noted in HPI. Denies pelvic pain, urinary tract symptoms, vaginitis symptoms, irregular bleeding, menopausal symptoms, change in bowel habits or rectal bleeding   Objective:    BP 114/70  Pulse 78  Ht 5' 5.25" (1.657 m)  Wt 192 lb (87.091 kg)  BMI 31.71 kg/m2  Breastfeeding? Yes    Wt Readings from Last 1 Encounters:  10/27/11 192 lb (87.091 kg)   Body mass index is 31.71 kg/(m^2). General Appearance: Alert, no acute distress HEENT: Grossly normal Neck / Thyroid: Supple, no thyromegaly or cervical adenopathy Lungs: Clear to auscultation bilaterally Back: No CVA tenderness Breast Exam: No masses or nodes.No dimpling, nipple retraction or discharge. Cardiovascular: Regular rate and rhythm.  Abdomen: soft, non-tender Pelvic Exam: EGBUS-wnl, vagina-normal rugae, cervix- without lesions or tenderness, uterus appears normal size shape and consistency, adnexae-no masses or tenderness Lymphatic Exam: Non-palpable nodes in neck, clavicular,  axillary, or inguinal regions  Skin: no rashes or abnormalities Extremities: no clubbing cyanosis or edema  Neurologic: grossly normal Psychiatric: Alert and oriented  UPT: negative   Assessment:   Routine GYN Exam Exercise Induced Asthma Weaning  Plan:  Begin with next cycle, Tri Sprintec  # 1 1 po qd 11 refills  PAP sent  RTO 1 year or prn  Stevey Stapleton,ELMIRAPA-C

## 2011-10-27 NOTE — Addendum Note (Signed)
Addended by: Henreitta Leber on: 10/27/2011 05:05 PM   Modules accepted: Orders

## 2011-10-27 NOTE — Progress Notes (Addendum)
Regular Periods: nobreast feeding Mammogram: no  Monthly Breast Ex.: no Exercise: yes  Tetanus < 10 years: yes Seatbelts: yes  NI. Bladder Functn.: yes Abuse at home: no  Daily BM's: yes Stressful Work: yes  Healthy Diet: yes Sigmoid-Colonoscopy: no  Calcium: no Medical problems this year: no problems   LAST PAP:6/12 nl  Contraception: CAMILLA PT WILL SOON STOP BREAST FEEDING; WANT SOMETHING ELSE FOR BIRTH CONTROL  Mammogram:  NO  PCP: NO  PMH:  NO CHANGE  FMH: NO CHANGE  Last Bone Scan: NO  PT IS MARRIED

## 2011-10-28 ENCOUNTER — Encounter: Payer: Self-pay | Admitting: Obstetrics and Gynecology

## 2011-10-28 LAB — PAP IG W/ RFLX HPV ASCU

## 2013-11-13 ENCOUNTER — Encounter: Payer: Self-pay | Admitting: Obstetrics and Gynecology

## 2014-01-02 ENCOUNTER — Other Ambulatory Visit: Payer: Self-pay | Admitting: Obstetrics & Gynecology

## 2014-01-02 DIAGNOSIS — N63 Unspecified lump in unspecified breast: Secondary | ICD-10-CM

## 2014-01-17 ENCOUNTER — Ambulatory Visit
Admission: RE | Admit: 2014-01-17 | Discharge: 2014-01-17 | Disposition: A | Discharge status: Home / Self Care | Payer: BC Managed Care – PPO | Source: Ambulatory Visit | Attending: Obstetrics & Gynecology | Admitting: Obstetrics & Gynecology

## 2014-01-17 DIAGNOSIS — N63 Unspecified lump in unspecified breast: Secondary | ICD-10-CM

## 2014-06-07 ENCOUNTER — Emergency Department (HOSPITAL_COMMUNITY): Payer: BC Managed Care – PPO

## 2014-06-07 ENCOUNTER — Emergency Department (HOSPITAL_COMMUNITY)
Admission: EM | Admit: 2014-06-07 | Discharge: 2014-06-07 | Disposition: A | Discharge status: Home / Self Care | Payer: BC Managed Care – PPO | Attending: Emergency Medicine | Admitting: Emergency Medicine

## 2014-06-07 ENCOUNTER — Encounter (HOSPITAL_COMMUNITY): Payer: Self-pay | Admitting: Emergency Medicine

## 2014-06-07 DIAGNOSIS — Z79899 Other long term (current) drug therapy: Secondary | ICD-10-CM | POA: Insufficient documentation

## 2014-06-07 DIAGNOSIS — Z8619 Personal history of other infectious and parasitic diseases: Secondary | ICD-10-CM | POA: Insufficient documentation

## 2014-06-07 DIAGNOSIS — R079 Chest pain, unspecified: Secondary | ICD-10-CM | POA: Diagnosis present

## 2014-06-07 DIAGNOSIS — J45909 Unspecified asthma, uncomplicated: Secondary | ICD-10-CM | POA: Insufficient documentation

## 2014-06-07 LAB — CBC
HCT: 41.1 % (ref 36.0–46.0)
Hemoglobin: 13.2 g/dL (ref 12.0–15.0)
MCH: 30.8 pg (ref 26.0–34.0)
MCHC: 32.1 g/dL (ref 30.0–36.0)
MCV: 96 fL (ref 78.0–100.0)
PLATELETS: 300 10*3/uL (ref 150–400)
RBC: 4.28 MIL/uL (ref 3.87–5.11)
RDW: 12.2 % (ref 11.5–15.5)
WBC: 5.3 10*3/uL (ref 4.0–10.5)

## 2014-06-07 LAB — BASIC METABOLIC PANEL
Anion gap: 10 (ref 5–15)
BUN: 8 mg/dL (ref 6–20)
CHLORIDE: 105 mmol/L (ref 101–111)
CO2: 24 mmol/L (ref 22–32)
Calcium: 9.4 mg/dL (ref 8.9–10.3)
Creatinine, Ser: 0.82 mg/dL (ref 0.44–1.00)
GFR calc Af Amer: >60 mL/min (ref >60)
GFR calc non Af Amer: >60 mL/min (ref >60)
Glucose, Bld: 97 mg/dL (ref 65–99)
Potassium: 5 mmol/L (ref 3.5–5.1)
SODIUM: 139 mmol/L (ref 135–145)

## 2014-06-07 LAB — I-STAT TROPONIN, ED
Troponin i, poc: 0 ng/mL (ref 0.00–0.08)
Troponin i, poc: 0 ng/mL (ref 0.00–0.08)

## 2014-06-07 LAB — D-DIMER, QUANTITATIVE (NOT AT ARMC): D DIMER QUANT: 0.34 ug{FEU}/mL (ref 0.00–0.48)

## 2014-06-07 LAB — BRAIN NATRIURETIC PEPTIDE: B NATRIURETIC PEPTIDE 5: 67.8 pg/mL (ref 0.0–100.0)

## 2014-06-07 MED ORDER — IBUPROFEN 800 MG PO TABS: 800.0000 mg | ORAL_TABLET | Freq: Three times a day (TID) | ORAL | Status: DC

## 2014-06-07 MED ORDER — NITROGLYCERIN 0.4 MG SL SUBL
0.4000 mg | SUBLINGUAL_TABLET | SUBLINGUAL | Status: DC | PRN
  Administered 2014-06-07: 0.4 mg via SUBLINGUAL
  Filled 2014-06-07: qty 1

## 2014-06-07 MED ORDER — ASPIRIN 81 MG PO CHEW
324.0000 mg | CHEWABLE_TABLET | Freq: Once | ORAL | Status: AC
  Administered 2014-06-07: 324 mg via ORAL
  Filled 2014-06-07: qty 4

## 2014-06-07 MED ORDER — KETOROLAC TROMETHAMINE 30 MG/ML IJ SOLN
30.0000 mg | Freq: Once | INTRAMUSCULAR | Status: AC
  Administered 2014-06-07: 30 mg via INTRAVENOUS
  Filled 2014-06-07: qty 1

## 2014-06-07 NOTE — ED Notes (Signed)
Pt did not want a wheel chair. Pt escorted out by this nurse.

## 2014-06-07 NOTE — ED Provider Notes (Signed)
CSN: [id number]     Arrival date & time 06/07/14  1110 History   First MD Initiated Contact with Patient 06/07/14 1124     Chief Complaint  Patient presents with  . Chest Pain     (Consider location/radiation/quality/duration/timing/severity/associated sxs/prior Treatment) Patient is a 34 y.o. female presenting with chest pain. The history is provided by the patient.  Chest Pain Pain location:  Substernal area Pain quality: dull and pressure   Pain radiates to:  Upper back Pain severity:  Moderate Onset quality:  Sudden Timing:  Constant Progression:  Unchanged Chronicity:  New Context: at rest   Relieved by:  Nothing Worsened by:  Nothing tried Associated symptoms: no abdominal pain, no cough, no fever, no shortness of breath and not vomiting     Past Medical History  Diagnosis Date  . Asthma   . Chlamydia    Past Surgical History  Procedure Laterality Date  . Cyst removal rt ear     Family History  Problem Relation Age of Onset  . Hypertension Mother   . Cancer Mother     leukemia  . Hypertension Brother   . Heart disease Maternal Grandmother   . Hypertension Maternal Grandmother   . Cancer Paternal Grandmother     breast  . Diabetes Paternal Grandmother    History  Substance Use Topics  . Smoking status: Never Smoker   . Smokeless tobacco: Never Used  . Alcohol Use: No   OB History    Gravida Para Term Preterm AB TAB SAB Ectopic Multiple Living   2 2 2       2      Review of Systems  Constitutional: Negative for fever.  Respiratory: Negative for cough and shortness of breath.   Cardiovascular: Positive for chest pain. Negative for leg swelling.  Gastrointestinal: Negative for vomiting and abdominal pain.  All other systems reviewed and are negative.     Allergies  Review of patient's allergies indicates no known allergies.  Home Medications   Prior to Admission medications   Medication Sig Start Date End Date Taking? Authorizing Provider   acetaminophen-codeine (TYLENOL #3) 300-30 MG per tablet Take 1 tablet by mouth every 4 (four) hours as needed. For pain     Historical Provider, MD  albuterol (PROVENTIL HFA) 108 (90 BASE) MCG/ACT inhaler Inhale 2 puffs into the lungs every 6 (six) hours as needed for wheezing. For shortness of breath or wheezing 10/27/11   Henreitta Leber, PA-C  Norgestimate-Ethinyl Estradiol Triphasic 0.18/0.215/0.25 MG-25 MCG tab Take 1 tablet by mouth daily. 10/27/11   Henreitta Leber, PA-C  prenatal vitamin w/FE, FA (PRENATAL 1 + 1) 27-1 MG TABS Take 1 tablet by mouth daily.      Historical Provider, MD   BP 122/76 mmHg  Pulse 48  Resp 14  Ht 5\' 6"  (1.676 m)  Wt 202 lb (91.627 kg)  BMI 32.62 kg/m2  SpO2 100%  LMP 05/31/2014 Physical Exam  Constitutional: She is oriented to person, place, and time. She appears well-developed and well-nourished. No distress.  HENT:  Head: Normocephalic and atraumatic.  Mouth/Throat: Oropharynx is clear and moist.  Eyes: EOM are normal. Pupils are equal, round, and reactive to light.  Neck: Normal range of motion. Neck supple. No JVD present.  Cardiovascular: Normal rate and regular rhythm.  Exam reveals no friction rub.   No murmur heard. Pulmonary/Chest: Effort normal and breath sounds normal. No respiratory distress. She has no wheezes. She has no rales.  Abdominal: Soft. She  exhibits no distension. There is no tenderness. There is no rebound.  Musculoskeletal: Normal range of motion. She exhibits no edema.  Neurological: She is alert and oriented to person, place, and time.  Skin: She is not diaphoretic.  Nursing note and vitals reviewed.   ED Course  Procedures (including critical care time) Labs Review Labs Reviewed  CBC  BASIC METABOLIC PANEL  D-DIMER, QUANTITATIVE (NOT AT [facility])  BRAIN NATRIURETIC PEPTIDE  I-STAT TROPOININ, ED    Imaging Review Dg Chest 2 View  06/07/2014   CLINICAL DATA:  Chest pain and shortness of breath for 2 days  EXAM: CHEST  2  VIEW  COMPARISON:  None.  FINDINGS: Lungs are clear. Heart size and pulmonary vascularity are within normal limits. No adenopathy. There is upper thoracic levoscoliosis. No bone lesions. No pneumothorax.  IMPRESSION: No edema or consolidation.   Electronically Signed   By: Bretta Bang III M.D.   On: 06/07/2014 12:58     EKG Interpretation   Date/Time:  Thursday Jun 07 2014 11:18:16 EDT Ventricular Rate:  81 PR Interval:  140 QRS Duration: 84 QT Interval:  342 QTC Calculation: 397 R Axis:   -83 Text Interpretation:  Sinus rhythm with frequent Premature ventricular  complexes in a pattern of bigeminy Biatrial enlargement Left axis  deviation No prior for comparison Confirmed by Gwendolyn Grant  MD, [place] ([id number]) on  06/07/2014 11:48:51 AM      MDM   Final diagnoses:  Chest pain, unspecified chest pain type    23F here with chest pain. Central, dull aching that radiates to her back. Sudden onset yesterday, persistent. Worse with ambulation and ambulation causes pain to become pressure-like. No diaphoresis, nausea, cough, SOB. She is on birth control. Sent from Urgent Care. Note from the provider noted her to have S3. Labs ordered there.  EKG here with bigeminy. I wonder if extra heart sounds were caused by bigeminy instead of a pathologic murmur. Will give aspirin and nitroglycerin.  Serial troponins negative. CXR normal. Normal BNP and D-dimer. Given motrin, PCP f/u.    Elwin Mocha, MD 06/07/14 [phone]

## 2014-06-07 NOTE — ED Notes (Signed)
PT placed in gown and in bed. Pt monitored by pulse ox, bp cuff and 5-lead.

## 2014-06-07 NOTE — ED Notes (Signed)
Chest pain started yesterday at 2pm-- "like someone is grabbing me-- goes through to back" short of breath with pain

## 2014-06-07 NOTE — ED Notes (Signed)
MD Walden at bedside 

## 2014-06-07 NOTE — Discharge Instructions (Signed)

## 2018-03-24 NOTE — Progress Notes (Signed)
Patient referred by Gordy Clement., PA-C for PVC's.  Subjective:   [name], female    DOB: 1980-09-15, 38 y.o.   MRN: [id number]   Chief Complaint  Patient presents with  . cardiac arrhythmia    NP Eval    HPI  38 y.o. African American female referred for evaluation of PVC's.   Patient is a Midwife for 15 yrs. She exercises 3 times/week. She denies chest pain, shortness of breath, leg edema, orthopnea, PND, TIA/syncope. She only feels occasional palpitations when she drinks coffee. She was noted to have ectopy on physical exam by her PCP. EKG confirmed PVC's, thus referred for further evaluation. She does not have any family history of premature CAD, heart failure, sudden cardiac death   Past Medical History:  Diagnosis Date  . Asthma   . Chlamydia      Past Surgical History:  Procedure Laterality Date  . Cyst removal rt ear       Social History   Socioeconomic History  . Marital status: Married    Spouse name: Not on file  . Number of children: 2  . Years of education: Not on file  . Highest education level: Not on file  Occupational History  . Not on file  Social Needs  . Financial resource strain: Not on file  . Food insecurity:    Worry: Not on file    Inability: Not on file  . Transportation needs:    Medical: Not on file    Non-medical: Not on file  Tobacco Use  . Smoking status: Never Smoker  . Smokeless tobacco: Never Used  Substance and Sexual Activity  . Alcohol use: No  . Drug use: No  . Sexual activity: Yes    Birth control/protection: Pill    Comment: camilla  Lifestyle  . Physical activity:    Days per week: Not on file    Minutes per session: Not on file  . Stress: Not on file  Relationships  . Social connections:    Talks on phone: Not on file    Gets together: Not on file    Attends religious service: Not on file    Active member of club or organization: Not on file    Attends meetings of clubs or  organizations: Not on file    Relationship status: Not on file  . Intimate partner violence:    Fear of current or ex partner: Not on file    Emotionally abused: Not on file    Physically abused: Not on file    Forced sexual activity: Not on file  Other Topics Concern  . Not on file  Social History Narrative  . Not on file     Current Outpatient Medications on File Prior to Visit  Medication Sig Dispense Refill  . albuterol (PROVENTIL HFA) 108 (90 BASE) MCG/ACT inhaler Inhale 2 puffs into the lungs every 6 (six) hours as needed for wheezing. For shortness of breath or wheezing 1 Inhaler 2  . ibuprofen (ADVIL,MOTRIN) 800 MG tablet Take 1 tablet (800 mg total) by mouth 3 (three) times daily. 21 tablet 0  . Norgestimate-Ethinyl Estradiol Triphasic 0.18/0.215/0.25 MG-25 MCG tab Take 1 tablet by mouth daily. 1 Package 11  . Norgestimate-Ethinyl Estradiol Triphasic 0.18/0.215/0.25 MG-35 MCG tablet Take 1 tablet by mouth daily.    . prenatal vitamin w/FE, FA (PRENATAL 1 + 1) 27-1 MG TABS Take 1 tablet by mouth daily.  No current facility-administered medications on file prior to visit.     Cardiovascular studies:  EKG 03/25/2018: Sinus rhythm 60 bpm Left atrial enlargement Frequent PVC's   Review of Systems  Constitution: Negative for decreased appetite, malaise/fatigue, weight gain and weight loss.  HENT: Negative for congestion.   Eyes: Negative for visual disturbance.  Cardiovascular: Positive for palpitations (Very occasional). Negative for chest pain, claudication, dyspnea on exertion, leg swelling and syncope.  Respiratory: Negative for shortness of breath.   Endocrine: Negative for cold intolerance.  Hematologic/Lymphatic: Does not bruise/bleed easily.  Skin: Negative for itching and rash.  Musculoskeletal: Negative for myalgias.  Gastrointestinal: Negative for abdominal pain, nausea and vomiting.  Genitourinary: Negative for dysuria.  Neurological: Negative for  dizziness and weakness.  Psychiatric/Behavioral: The patient is not nervous/anxious.   All other systems reviewed and are negative.        Vitals:   03/25/18 1459  BP: (!) 106/57  Pulse: (!) 51  SpO2: 100%    Objective:   Physical Exam  Constitutional: She is oriented to person, place, and time. She appears well-developed and well-nourished. No distress.  HENT:  Head: Normocephalic and atraumatic.  Eyes: Pupils are equal, round, and reactive to light. Conjunctivae are normal.  Neck: No JVD present.  Cardiovascular: Normal rate, regular rhythm and intact distal pulses. Frequent extrasystoles are present.  Pulmonary/Chest: Effort normal and breath sounds normal. She has no wheezes. She has no rales.  Abdominal: Soft. Bowel sounds are normal. There is no rebound.  Musculoskeletal:        General: No edema.  Lymphadenopathy:    She has no cervical adenopathy.  Neurological: She is alert and oriented to person, place, and time. No cranial nerve deficit.  Skin: Skin is warm and dry.  Psychiatric: She has a normal mood and affect.  Nursing note and vitals reviewed.         Assessment & Recommendations:   38 y/o [place] American female with asymptomatic PVC's  Recommend echocardiogram to rule out structural abnormality. Recommend Holter monitor to assess PVC burden. I will see her after the tests.   Thank you for referring the patient to [place]. Please feel free to contact with any questions.  Elder Negus, MD [facility] Pager: [phone] Office: [phone] If no answer Cell [phone]

## 2018-03-25 ENCOUNTER — Encounter: Payer: Self-pay | Admitting: Cardiology

## 2018-03-25 ENCOUNTER — Ambulatory Visit: Payer: BC Managed Care – PPO | Admitting: Cardiology

## 2018-03-25 ENCOUNTER — Other Ambulatory Visit: Payer: Self-pay

## 2018-03-25 VITALS — BP 106/57 | HR 51 | Ht 66.0 in | Wt 203.3 lb

## 2018-03-25 DIAGNOSIS — I499 Cardiac arrhythmia, unspecified: Secondary | ICD-10-CM | POA: Diagnosis not present

## 2018-03-25 DIAGNOSIS — I493 Ventricular premature depolarization: Secondary | ICD-10-CM

## 2018-03-28 DIAGNOSIS — I493 Ventricular premature depolarization: Secondary | ICD-10-CM | POA: Diagnosis not present

## 2018-03-30 ENCOUNTER — Ambulatory Visit: Payer: BC Managed Care – PPO

## 2018-03-30 ENCOUNTER — Other Ambulatory Visit: Payer: Self-pay

## 2018-03-30 DIAGNOSIS — I493 Ventricular premature depolarization: Secondary | ICD-10-CM | POA: Diagnosis not present

## 2018-04-01 ENCOUNTER — Telehealth: Payer: Self-pay | Admitting: Cardiology

## 2018-04-01 NOTE — Telephone Encounter (Signed)
Reviewed. 33% ventricular ectopy burden.   Thanks MJP

## 2018-04-03 NOTE — Progress Notes (Signed)
Patient referred by Gordy Clement., PA-C for PVC's.  Subjective:   [name], female    DOB: [birth year], 38 y.o.   MRN: [id number]    Chief Complaint  Patient presents with  . Results    echo, monitor  . Follow-up    HPI  38 y.o. African American female seen by me for evaluation of PVC's.   Holter monitor showed 33% PVC's.  Except for 1 run of 6 beat nonsustained ventricular tachycardia with polymorphic PVCs, rest all are singlets, couplets, or triplets of predominantly monomorphic PVCs. Echocardiogram showed mildly reduced LVEF 40-45%. Patient is here to discuss the results.   Patient continues to have no symptoms of heart failure, such as dyspnea, orthopnea, leg edema, PND.  Past Medical History:  Diagnosis Date  . Asthma   . Chlamydia      Past Surgical History:  Procedure Laterality Date  . Cyst removal rt ear       Social History   Socioeconomic History  . Marital status: Married    Spouse name: Not on file  . Number of children: 2  . Years of education: Not on file  . Highest education level: Not on file  Occupational History  . Not on file  Social Needs  . Financial resource strain: Not on file  . Food insecurity:    Worry: Not on file    Inability: Not on file  . Transportation needs:    Medical: Not on file    Non-medical: Not on file  Tobacco Use  . Smoking status: Never Smoker  . Smokeless tobacco: Never Used  Substance and Sexual Activity  . Alcohol use: No  . Drug use: No  . Sexual activity: Yes    Birth control/protection: Pill    Comment: camilla  Lifestyle  . Physical activity:    Days per week: Not on file    Minutes per session: Not on file  . Stress: Not on file  Relationships  . Social connections:    Talks on phone: Not on file    Gets together: Not on file    Attends religious service: Not on file    Active member of club or organization: Not on file    Attends meetings of clubs or organizations: Not on file     Relationship status: Not on file  . Intimate partner violence:    Fear of current or ex partner: Not on file    Emotionally abused: Not on file    Physically abused: Not on file    Forced sexual activity: Not on file  Other Topics Concern  . Not on file  Social History Narrative  . Not on file     Current Outpatient Medications on File Prior to Visit  Medication Sig Dispense Refill  . albuterol (PROVENTIL HFA) 108 (90 BASE) MCG/ACT inhaler Inhale 2 puffs into the lungs every 6 (six) hours as needed for wheezing. For shortness of breath or wheezing 1 Inhaler 2  . ibuprofen (ADVIL,MOTRIN) 800 MG tablet Take 1 tablet (800 mg total) by mouth 3 (three) times daily. 21 tablet 0   No current facility-administered medications on file prior to visit.     Cardiovascular studies:  Echocardiogram 03/30/2018: Left ventricle cavity is normal in size. Mild decrease in global wall motion. Visual EF is 40-45%. Wall motion and diastolic function assessment limited due to frequent PVC's. Calculated EF 42%. Right atrial cavity is mildly dilated. Moderate (Grade II) mitral regurgitation. Mild  tricuspid regurgitation. Mild pulmonary hypertension. Estimated pulmonary artery systolic pressure 35 mmHg. Insignificant pericardial effusion.  Holter monitor 03/25/2018: Min HR 64 bpm, max HR 120 bpm. Average HR 79 bpm. 33.7 ventricular ecopy in the form of single PVC's, couplets, triplet, bigemiy, Longest NSVT was 6 beats. All ventricular ecopy was monomorphic. 0.1% supraventricular ectopy. No Afib, atiral flutter, high grade AV block.  EKG 03/25/2018: Sinus rhythm 60 bpm. Left atrial enlargement Frequent PVC's   Review of Systems  Constitution: Negative for decreased appetite, malaise/fatigue, weight gain and weight loss.  HENT: Negative for congestion.   Eyes: Negative for visual disturbance.  Cardiovascular: Positive for palpitations (Very occasional). Negative for chest pain, claudication,  dyspnea on exertion, leg swelling and syncope.  Respiratory: Negative for shortness of breath.   Endocrine: Negative for cold intolerance.  Hematologic/Lymphatic: Does not bruise/bleed easily.  Skin: Negative for itching and rash.  Musculoskeletal: Negative for myalgias.  Gastrointestinal: Negative for abdominal pain, nausea and vomiting.  Genitourinary: Negative for dysuria.  Neurological: Negative for dizziness and weakness.  Psychiatric/Behavioral: The patient is not nervous/anxious.   All other systems reviewed and are negative.        Vitals:   04/04/18 1037  BP: (!) 110/52  Pulse: (!) 37  SpO2: 100%    Objective:   Physical Exam  Constitutional: She is oriented to person, place, and time. She appears well-developed and well-nourished. No distress.  HENT:  Head: Normocephalic and atraumatic.  Eyes: Pupils are equal, round, and reactive to light. Conjunctivae are normal.  Neck: No JVD present.  Cardiovascular: Normal rate, regular rhythm and intact distal pulses. Frequent extrasystoles are present.  Pulmonary/Chest: Effort normal and breath sounds normal. She has no wheezes. She has no rales.  Abdominal: Soft. Bowel sounds are normal. There is no rebound.  Musculoskeletal:        General: No edema.  Lymphadenopathy:    She has no cervical adenopathy.  Neurological: She is alert and oriented to person, place, and time. No cranial nerve deficit.  Skin: Skin is warm and dry.  Psychiatric: She has a normal mood and affect.  Nursing note and vitals reviewed.         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 Will start on low-dose beta blocker metoprolol tartrate 12.5 mg twice daily.  I will see her back in 8 weeks and repeat EKG and Holter monitor at that time. If no  improvement, may need to consider antiarrhythmic therapy- such as sotalol, or consideration for ablation.    Elder Negus, MD [facility] Pager: [phone] Office: [phone] If no answer Cell [phone]

## 2018-04-04 ENCOUNTER — Ambulatory Visit: Payer: BC Managed Care – PPO | Admitting: Cardiology

## 2018-04-04 ENCOUNTER — Other Ambulatory Visit: Payer: Self-pay

## 2018-04-04 ENCOUNTER — Encounter: Payer: Self-pay | Admitting: Cardiology

## 2018-04-04 VITALS — BP 110/52 | HR 37 | Ht 66.0 in | Wt 203.4 lb

## 2018-04-04 DIAGNOSIS — I472 Ventricular tachycardia: Secondary | ICD-10-CM | POA: Diagnosis not present

## 2018-04-04 DIAGNOSIS — I429 Cardiomyopathy, unspecified: Secondary | ICD-10-CM

## 2018-04-04 DIAGNOSIS — I493 Ventricular premature depolarization: Secondary | ICD-10-CM | POA: Diagnosis not present

## 2018-04-04 DIAGNOSIS — I4729 Other ventricular tachycardia: Secondary | ICD-10-CM

## 2018-04-05 ENCOUNTER — Telehealth: Payer: Self-pay

## 2018-04-05 ENCOUNTER — Other Ambulatory Visit: Payer: BC Managed Care – PPO

## 2018-04-05 DIAGNOSIS — I472 Ventricular tachycardia: Secondary | ICD-10-CM

## 2018-04-05 DIAGNOSIS — I493 Ventricular premature depolarization: Secondary | ICD-10-CM

## 2018-04-05 DIAGNOSIS — I429 Cardiomyopathy, unspecified: Secondary | ICD-10-CM

## 2018-04-05 DIAGNOSIS — I4729 Other ventricular tachycardia: Secondary | ICD-10-CM

## 2018-04-05 MED ORDER — METOPROLOL TARTRATE 25 MG PO TABS: 12.5000 mg | ORAL_TABLET | Freq: Two times a day (BID) | ORAL | 3 refills | Status: DC

## 2018-04-05 NOTE — Telephone Encounter (Signed)
Pt called left vm stating that she was suppose to have a medication called in and I dont see anything? Please advise

## 2018-05-05 ENCOUNTER — Ambulatory Visit: Payer: BC Managed Care – PPO | Admitting: Cardiology

## 2018-06-02 ENCOUNTER — Ambulatory Visit: Payer: BC Managed Care – PPO

## 2018-06-02 ENCOUNTER — Ambulatory Visit (INDEPENDENT_AMBULATORY_CARE_PROVIDER_SITE_OTHER): Payer: BC Managed Care – PPO | Admitting: Cardiology

## 2018-06-02 ENCOUNTER — Encounter: Payer: Self-pay | Admitting: Cardiology

## 2018-06-02 ENCOUNTER — Other Ambulatory Visit: Payer: Self-pay

## 2018-06-02 VITALS — BP 120/51 | HR 37 | Temp 96.0°F | Ht 66.0 in | Wt 211.0 lb

## 2018-06-02 DIAGNOSIS — I499 Cardiac arrhythmia, unspecified: Secondary | ICD-10-CM | POA: Diagnosis not present

## 2018-06-02 DIAGNOSIS — I493 Ventricular premature depolarization: Secondary | ICD-10-CM | POA: Diagnosis not present

## 2018-06-02 DIAGNOSIS — I498 Other specified cardiac arrhythmias: Secondary | ICD-10-CM

## 2018-06-02 DIAGNOSIS — I428 Other cardiomyopathies: Secondary | ICD-10-CM

## 2018-06-02 NOTE — Progress Notes (Signed)
Patient referred by Gordy Clement., PA-C for PVC's.  Subjective:   [name], female    DOB: 06-Jul-1980, 38 y.o.   MRN: [id number]    Chief Complaint  Patient presents with  . Cardiomyopathy  . Follow-up    HPI  38 y/o [place] American female with previously asymptomatic PVC's, mildly reduced LVEF  Holter monitor on prior to treatment showed 33% PVC's.  Except for 1 run of 6 beat nonsustained ventricular tachycardia with polymorphic PVCs, rest all are singlets, couplets, or triplets of predominantly monomorphic PVCs. Echocardiogram showed mildly reduced LVEF 40-45%. At last visit, I started the patient on metoprolol 12.5 mg bid.  She is now noticing exertional dyspnea and fatigue symptoms. She has occasional chest tightness. She is compliant with metoprolol.    Past Medical History:  Diagnosis Date  . Asthma   . Chlamydia      Past Surgical History:  Procedure Laterality Date  . Cyst removal rt ear       Social History   Socioeconomic History  . Marital status: Married    Spouse name: Not on file  . Number of children: 2  . Years of education: Not on file  . Highest education level: Not on file  Occupational History  . Not on file  Social Needs  . Financial resource strain: Not on file  . Food insecurity:    Worry: Not on file    Inability: Not on file  . Transportation needs:    Medical: Not on file    Non-medical: Not on file  Tobacco Use  . Smoking status: Never Smoker  . Smokeless tobacco: Never Used  Substance and Sexual Activity  . Alcohol use: No  . Drug use: No  . Sexual activity: Yes    Birth control/protection: Pill    Comment: camilla  Lifestyle  . Physical activity:    Days per week: Not on file    Minutes per session: Not on file  . Stress: Not on file  Relationships  . Social connections:    Talks on phone: Not on file    Gets together: Not on file    Attends religious service: Not on file    Active member of club or  organization: Not on file    Attends meetings of clubs or organizations: Not on file    Relationship status: Not on file  . Intimate partner violence:    Fear of current or ex partner: Not on file    Emotionally abused: Not on file    Physically abused: Not on file    Forced sexual activity: Not on file  Other Topics Concern  . Not on file  Social History Narrative  . Not on file     Current Outpatient Medications on File Prior to Visit  Medication Sig Dispense Refill  . albuterol (PROVENTIL HFA) 108 (90 BASE) MCG/ACT inhaler Inhale 2 puffs into the lungs every 6 (six) hours as needed for wheezing. For shortness of breath or wheezing 1 Inhaler 2  . ibuprofen (ADVIL,MOTRIN) 800 MG tablet Take 1 tablet (800 mg total) by mouth 3 (three) times daily. (Patient taking differently: Take 800 mg by mouth as needed. ) 21 tablet 0  . metoprolol tartrate (LOPRESSOR) 25 MG tablet Take 0.5 tablets (12.5 mg total) by mouth 2 (two) times daily. 60 tablet 3   No current facility-administered medications on file prior to visit.     Cardiovascular studies:  EKG 06/02/2018: Sinus rhythm  with ventricular bigeminy.  Left atrial enlargement.  Inferolateral T wave inversions No significant change compared to previous EKG on 03/25/2018  Echocardiogram 03/30/2018: Left ventricle cavity is normal in size. Mild decrease in global wall motion. Visual EF is 40-45%. Wall motion and diastolic function assessment limited due to frequent PVC's. Calculated EF 42%. Right atrial cavity is mildly dilated. Moderate (Grade II) mitral regurgitation. Mild tricuspid regurgitation. Mild pulmonary hypertension. Estimated pulmonary artery systolic pressure 35 mmHg. Insignificant pericardial effusion.  Holter monitor 03/25/2018: Min HR 64 bpm, max HR 120 bpm. Average HR 79 bpm. 33.7% ventricular ecopy in the form of single PVC's, couplets, triplet, bigemiy, Longest NSVT was 6 beats. All ventricular ecopy was  monomorphic. 0.1% supraventricular ectopy. No Afib, atiral flutter, high grade AV block.   Review of Systems  Constitution: Positive for malaise/fatigue. Negative for decreased appetite, weight gain and weight loss.  HENT: Negative for congestion.   Eyes: Negative for visual disturbance.  Cardiovascular: Positive for dyspnea on exertion and palpitations. Negative for chest pain, claudication, leg swelling and syncope.  Respiratory: Positive for shortness of breath.   Endocrine: Negative for cold intolerance.  Hematologic/Lymphatic: Does not bruise/bleed easily.  Skin: Negative for itching and rash.  Musculoskeletal: Negative for myalgias.  Gastrointestinal: Negative for abdominal pain, nausea and vomiting.  Genitourinary: Negative for dysuria.  Neurological: Negative for dizziness and weakness.  Psychiatric/Behavioral: The patient is not nervous/anxious.   All other systems reviewed and are negative.        Vitals:   06/02/18 1225  BP: (!) 120/51  Pulse: (!) 37  Temp: (!) 96 F (35.6 C)  SpO2: 99%    Objective:   Physical Exam  Constitutional: She is oriented to person, place, and time. She appears well-developed and well-nourished. No distress.  HENT:  Head: Normocephalic and atraumatic.  Eyes: Pupils are equal, round, and reactive to light. Conjunctivae are normal.  Neck: No JVD present.  Cardiovascular: Normal rate, regular rhythm and intact distal pulses. Frequent extrasystoles are present.  Pulmonary/Chest: Effort normal and breath sounds normal. She has no wheezes. She has no rales.  Abdominal: Soft. Bowel sounds are normal. There is no rebound.  Musculoskeletal:        General: No edema.  Lymphadenopathy:    She has no cervical adenopathy.  Neurological: She is alert and oriented to person, place, and time. No cranial nerve deficit.  Skin: Skin is warm and dry.  Psychiatric: She has a normal mood and affect.  Nursing note and vitals reviewed.          Assessment & Recommendations:   38 y/o [place] female with symptomatic PVC's, mildly reduced LVEF  Symptomatic PVCs: Likely reason for perceived bradycardia. EKG today shows ventricular bigeminy. Given her symptoms, mildly reduced EF, she needs rhythm control therapy. With her reduced EF, her only medical treatment option is amiodarone. I would advice against the use of amiodarone, given the side effect profile in a young and an otherwise healthy patient. I will refer her to EP for consideration of ablation therapy.  In the meantime, increase metoprolol to 25 mg bid, and repeat 24 Hr Holter monitor on metoprolol. I will also obtain nuclear stress test for noninvasive evaluation of ischemia.   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 Management as above.   Elder Negus, MD [facility]. [facility] Pager: [phone] Office: [phone] If no answer Cell [phone]

## 2018-06-08 DIAGNOSIS — R000 Tachycardia, unspecified: Secondary | ICD-10-CM | POA: Diagnosis not present

## 2018-06-10 ENCOUNTER — Telehealth: Payer: Self-pay | Admitting: *Deleted

## 2018-06-10 ENCOUNTER — Other Ambulatory Visit: Payer: Self-pay

## 2018-06-10 MED ORDER — METOPROLOL TARTRATE 25 MG PO TABS: 25.0000 mg | ORAL_TABLET | Freq: Two times a day (BID) | ORAL | 2 refills | Status: DC

## 2018-06-10 NOTE — Telephone Encounter (Signed)
Virtual Visit Pre-Appointment Phone Call  "(Name), I am calling you today to discuss your upcoming appointment. We are currently trying to limit exposure to the virus that causes COVID-19 by seeing patients at home rather than in the office."  1. "What is the BEST phone number to call the day of the visit?" - include this in appointment notes  2. "Do you have or have access to (through a family member/friend) a smartphone with video capability that we can use for your visit?" a. If yes - list this number in appt notes as "cell" (if different from BEST phone #) and list the appointment type as a VIDEO visit in appointment notes b. If no - list the appointment type as a PHONE visit in appointment notes  3. Confirm consent - "In the setting of the current Covid19 crisis, you are scheduled for a (phone or video) visit with your provider on (date) at (time).  Just as we do with many in-office visits, in order for you to participate in this visit, we must obtain consent.  If you'd like, I can send this to your mychart (if signed up) or email for you to review.  Otherwise, I can obtain your verbal consent now.  All virtual visits are billed to your insurance company just like a normal visit would be.  By agreeing to a virtual visit, we'd like you to understand that the technology does not allow for your provider to perform an examination, and thus may limit your provider's ability to fully assess your condition. If your provider identifies any concerns that need to be evaluated in person, we will make arrangements to do so.  Finally, though the technology is pretty good, we cannot assure that it will always work on either your or our end, and in the setting of a video visit, we may have to convert it to a phone-only visit.  In either situation, we cannot ensure that we have a secure connection.  Are you willing to proceed?" STAFF: Did the patient verbally acknowledge consent to telehealth visit? Document  YES/NO here: YES  4. Advise patient to be prepared - "Two hours prior to your appointment, go ahead and check your blood pressure, pulse, oxygen saturation, and your weight (if you have the equipment to check those) and write them all down. When your visit starts, your provider will ask you for this information. If you have an Apple Watch or Kardia device, please plan to have heart rate information ready on the day of your appointment. Please have a pen and paper handy nearby the day of the visit as well."  5. Give patient instructions for MyChart download to smartphone OR Doximity/Doxy.me as below if video visit (depending on what platform provider is using)  6. Inform patient they will receive a phone call 15 minutes prior to their appointment time (may be from unknown caller ID) so they should be prepared to answer    TELEPHONE CALL NOTE  Jalaysha L Kromer has been deemed a candidate for a follow-up tele-health visit to limit community exposure during the Covid-19 pandemic. I spoke with the patient via phone to ensure availability of phone/video source, confirm preferred email & phone number, and discuss instructions and expectations.  I reminded Leasia L Fakhouri to be prepared with any vital sign and/or heart rhythm information that could potentially be obtained via home monitoring, at the time of her visit. I reminded Nairi Rominger Mcmillen to expect a phone call prior to  her visit.  Gerrell Tabet 06/10/2018 3:22 PM   INSTRUCTIONS FOR DOWNLOADING THE MYCHART APP TO SMARTPHONE  - The patient must first make sure to have activated MyChart and know their login information - If Apple, go to [employer] and type in MyChart in the search bar and download the app. If Android, ask patient to go to [employer] and type in [place] in the search bar and download the app. The app is free but as with any other app downloads, their phone may require them to verify saved payment information or Apple/Android  password.  - The patient will need to then log into the app with their MyChart username and password, and select [facility] as their healthcare provider to link the account. When it is time for your visit, go to the MyChart app, find appointments, and click Begin Video Visit. Be sure to Select Allow for your device to access the Microphone and Camera for your visit. You will then be connected, and your provider will be with you shortly.  **If they have any issues connecting, or need assistance please contact MyChart service desk [phone])**  **If using a computer, in order to ensure the best quality for their visit they will need to use either of the following Internet Browsers: [employer] or Google Chrome**  IF USING DOXIMITY or DOXY.ME - The patient will receive a link just prior to their visit by text.     FULL LENGTH CONSENT FOR TELE-HEALTH VISIT   I hereby voluntarily request, consent and authorize [facility] and its employed or contracted physicians, physician assistants, nurse practitioners or other licensed health care professionals (the Practitioner), to provide me with telemedicine health care services (the "Services") as deemed necessary by the treating Practitioner. I acknowledge and consent to receive the Services by the Practitioner via telemedicine. I understand that the telemedicine visit will involve communicating with the Practitioner through live audiovisual communication technology and the disclosure of certain medical information by electronic transmission. I acknowledge that I have been given the opportunity to request an in-person assessment or other available alternative prior to the telemedicine visit and am voluntarily participating in the telemedicine visit.  I understand that I have the right to withhold or withdraw my consent to the use of telemedicine in the course of my care at any time, without affecting my right to future care or treatment,  and that the Practitioner or I may terminate the telemedicine visit at any time. I understand that I have the right to inspect all information obtained and/or recorded in the course of the telemedicine visit and may receive copies of available information for a reasonable fee.  I understand that some of the potential risks of receiving the Services via telemedicine include:  Marland Kitchen Delay or interruption in medical evaluation due to technological equipment failure or disruption; . Information transmitted may not be sufficient (e.g. poor resolution of images) to allow for appropriate medical decision making by the Practitioner; and/or  . In rare instances, security protocols could fail, causing a breach of personal health information.  Furthermore, I acknowledge that it is my responsibility to provide information about my medical history, conditions and care that is complete and accurate to the best of my ability. I acknowledge that Practitioner's advice, recommendations, and/or decision may be based on factors not within their control, such as incomplete or inaccurate data provided by me or distortions of diagnostic images or specimens that may result from electronic transmissions. I understand  that the practice of medicine is not an exact science and that Practitioner makes no warranties or guarantees regarding treatment outcomes. I acknowledge that I will receive a copy of this consent concurrently upon execution via email to the email address I last provided but may also request a printed copy by calling the office of [facility].    I understand that my insurance will be billed for this visit.   I have read or had this consent read to me. . I understand the contents of this consent, which adequately explains the benefits and risks of the Services being provided via telemedicine.  . I have been provided ample opportunity to ask questions regarding this consent and the Services and have had my questions  answered to my satisfaction. . I give my informed consent for the services to be provided through the use of telemedicine in my medical care  By participating in this telemedicine visit I agree to the above.

## 2018-06-13 ENCOUNTER — Telehealth (INDEPENDENT_AMBULATORY_CARE_PROVIDER_SITE_OTHER): Payer: BC Managed Care – PPO | Admitting: Cardiology

## 2018-06-13 ENCOUNTER — Other Ambulatory Visit: Payer: Self-pay

## 2018-06-13 DIAGNOSIS — I499 Cardiac arrhythmia, unspecified: Secondary | ICD-10-CM

## 2018-06-13 DIAGNOSIS — I498 Other specified cardiac arrhythmias: Secondary | ICD-10-CM

## 2018-06-13 NOTE — Progress Notes (Signed)
Virtual Visit via Video Note   This visit type was conducted due to national recommendations for restrictions regarding the COVID-19 Pandemic (e.g. social distancing) in an effort to limit this patient's exposure and mitigate transmission in our community.  Due to her co-morbid illnesses, this patient is at least at moderate risk for complications without adequate follow up.  This format is felt to be most appropriate for this patient at this time.  All issues noted in this document were discussed and addressed.  A limited physical exam was performed with this format.  Please refer to the patient's chart for her consent to telehealth for [facility].   Date:  06/13/2018   ID:  [name], DOB 1980/03/10, MRN [id number]  Patient Location: Home Provider Location: Home  PCP:  Gordy Clement., PA-C  Cardiologist:  [place] Electrophysiologist:  None   Evaluation Performed:  Consultation - Leah Copa Gloor was referred by [facility] for the evaluation of PVCs.  Chief Complaint: Palpitations  History of Present Illness:    [name] is a 38 y.o. female with a history of PVCs and a mild or reduced ejection fraction.  Prior Holter monitor showed a 33% PVC burden.  Most recent Holter shows PVC burden at 46% with an ejection fraction of 40 to 45%.  She is having symptoms of dyspnea and fatigue.  Her symptoms going on for the past few years.  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  She did get sick with a cold this past winter and on presentation to her family physician, noted to have PVCs.  The patient does not have symptoms concerning for COVID-19 infection (fever, chills, cough, or new shortness of breath).    Past Medical History:  Diagnosis Date  . Asthma   . Chlamydia    Past Surgical History:  Procedure Laterality Date  . Cyst removal rt ear       Current Meds  Medication Sig  . albuterol (PROVENTIL HFA) 108 (90 BASE) MCG/ACT inhaler  Inhale 2 puffs into the lungs every 6 (six) hours as needed for wheezing. For shortness of breath or wheezing  . metoprolol tartrate (LOPRESSOR) 25 MG tablet Take 1 tablet (25 mg total) by mouth 2 (two) times daily.     Allergies:   Patient has no known allergies.   Social History   Tobacco Use  . Smoking status: Never Smoker  . Smokeless tobacco: Never Used  Substance Use Topics  . Alcohol use: Yes    Comment: occ  . Drug use: No     Family Hx: The patient's family history includes Cancer in her mother and paternal grandmother; Diabetes in her paternal grandmother; Heart attack (age of onset: 66) in her father; Heart attack (age of onset: 2) in her maternal grandmother; Hypertension in her brother, maternal grandmother, and mother.  ROS:   Please see the history of present illness.     All other systems reviewed and are negative.   Prior CV studies:   The following studies were reviewed today:  TTE 04/03/18 Left ventricle cavity is normal in size. Mild decrease in global wall motion. Visual EF is 40-45%. Wall motion and diastolic function assessment limited due to frequent PVC's. Calculated EF 42%. Right atrial cavity is mildly dilated. Moderate (Grade II) mitral regurgitation. Mild tricuspid regurgitation. Mild pulmonary hypertension. Estimated pulmonary artery systolic pressure 35 mmHg. Insignificant pericardial effusion.  Holter 06/02/18 personally reviewed Dominant rhythm sinus. Min HR  53 bpm. Max HR 81 bpm. Avg HR 75 bpm. >40,000 ventricular ectopy beats (47.6% ectopy burden) including PVC, couplets, ventricular bigeminy. No nonsustained/sustained ventricular tachycardia. Occasional supraventricular ectopy (<1%). No other arrhthymias.  Occasional episodes of chest tightness and shortness of breath reported.   Labs/Other Tests and Data Reviewed:    EKG:  An ECG dated 06/02/2018 was personally reviewed today and demonstrated:  Sinus rhythm, ventricular  bigeminy  Recent Labs: No results found for requested labs within last 8760 hours.   Recent Lipid Panel No results found for: CHOL, TRIG, HDL, CHOLHDL, LDLCALC, LDLDIRECT  Wt Readings from Last 3 Encounters:  06/02/18 211 lb (95.7 kg)  04/04/18 203 lb 6.4 oz (92.3 kg)  03/25/18 203 lb 4.8 oz (92.2 kg)     Objective:    Vital Signs:  There were no vitals taken for this visit.   GEN:  no acute distress EYES:  sclerae anicteric, EOMI - Extraocular Movements Intact RESPIRATORY:  normal respiratory effort, symmetric expansion CARDIOVASCULAR:  no peripheral edema SKIN:  no rash, lesions or ulcers. MUSCULOSKELETAL:  no obvious deformities. NEURO:  alert and oriented x 3, no obvious focal deficit PSYCH:  normal affect  ASSESSMENT & PLAN:    1. PVCs: High burden based on most recent monitor.  ECG shows ventricular bigeminy with left bundle branch block superior axis PVCs.  At this point, she would like to avoid medications. Plan for ablation of her PVCs.  Risks and benefits were discussed include bleeding, tamponade, heart block, stroke, damage to surrounding organs.  She understands these risks and is agreed to the procedure. 2. Cardiomyopathy: Likely due to high PVC burden.  Kathyjo Briere likely resolve with therapy.  COVID-19 Education: The signs and symptoms of COVID-19 were discussed with the patient and how to seek care for testing (follow up with PCP or arrange E-visit).  The importance of social distancing was discussed today.  Time:   Today, I have spent 12 minutes with the patient with telehealth technology discussing the above problems.     Medication Adjustments/Labs and Tests Ordered: Current medicines are reviewed at length with the patient today.  Concerns regarding medicines are outlined above.   Tests Ordered: No orders of the defined types were placed in this encounter. Case discussed with referring cardiologist  Medication Changes: No orders of the defined types were  placed in this encounter.   Disposition:  Follow up in 3 month(s)  Signed, Nimo Verastegui [name], MD  06/13/2018 11:56 AM    [facility]

## 2018-06-23 ENCOUNTER — Telehealth: Payer: Self-pay | Admitting: *Deleted

## 2018-06-23 NOTE — Telephone Encounter (Signed)
PVC ablation scheduled for 6/18. Procedure instructions reviewed w/ pt. Aware to arrive at 5:30 am COVID screening testing scheduled for 6/15. Screening instructions reviewed with pt. Post procedure f/u scheduled for 7/17. Patient verbalized understanding and agreeable to plan.

## 2018-06-27 ENCOUNTER — Other Ambulatory Visit: Payer: BC Managed Care – PPO

## 2018-06-27 ENCOUNTER — Other Ambulatory Visit (HOSPITAL_COMMUNITY)
Admission: RE | Admit: 2018-06-27 | Discharge: 2018-06-27 | Disposition: A | Discharge status: Home / Self Care | Payer: BC Managed Care – PPO | Source: Ambulatory Visit | Attending: Cardiology | Admitting: Cardiology

## 2018-06-27 DIAGNOSIS — Z1159 Encounter for screening for other viral diseases: Secondary | ICD-10-CM | POA: Diagnosis not present

## 2018-06-29 LAB — NOVEL CORONAVIRUS, NAA (HOSP ORDER, SEND-OUT TO REF LAB; TAT 18-24 HRS): SARS-CoV-2, NAA: NOT DETECTED

## 2018-06-30 ENCOUNTER — Encounter (HOSPITAL_COMMUNITY): Payer: Self-pay | Admitting: Anesthesiology

## 2018-06-30 ENCOUNTER — Encounter (HOSPITAL_COMMUNITY)
Admission: RE | Disposition: A | Discharge status: Home / Self Care | Payer: Self-pay | Source: Home / Self Care | Attending: Cardiology

## 2018-06-30 ENCOUNTER — Ambulatory Visit (HOSPITAL_COMMUNITY): Payer: BC Managed Care – PPO | Admitting: Anesthesiology

## 2018-06-30 ENCOUNTER — Ambulatory Visit (HOSPITAL_COMMUNITY)
Admission: RE | Admit: 2018-06-30 | Discharge: 2018-06-30 | Disposition: A | Discharge status: Home / Self Care | Payer: BC Managed Care – PPO | Attending: Cardiology | Admitting: Cardiology

## 2018-06-30 ENCOUNTER — Other Ambulatory Visit: Payer: Self-pay

## 2018-06-30 DIAGNOSIS — J45909 Unspecified asthma, uncomplicated: Secondary | ICD-10-CM | POA: Insufficient documentation

## 2018-06-30 DIAGNOSIS — I493 Ventricular premature depolarization: Secondary | ICD-10-CM | POA: Diagnosis not present

## 2018-06-30 DIAGNOSIS — Z79899 Other long term (current) drug therapy: Secondary | ICD-10-CM | POA: Insufficient documentation

## 2018-06-30 DIAGNOSIS — E669 Obesity, unspecified: Secondary | ICD-10-CM | POA: Insufficient documentation

## 2018-06-30 DIAGNOSIS — Z6833 Body mass index (BMI) 33.0-33.9, adult: Secondary | ICD-10-CM | POA: Diagnosis not present

## 2018-06-30 DIAGNOSIS — I428 Other cardiomyopathies: Secondary | ICD-10-CM | POA: Diagnosis not present

## 2018-06-30 HISTORY — PX: PVC ABLATION: EP1236

## 2018-06-30 LAB — CBC
HCT: 40.1 % (ref 36.0–46.0)
Hemoglobin: 13.2 g/dL (ref 12.0–15.0)
MCH: 31.4 pg (ref 26.0–34.0)
MCHC: 32.9 g/dL (ref 30.0–36.0)
MCV: 95.5 fL (ref 80.0–100.0)
Platelets: 391 10*3/uL (ref 150–400)
RBC: 4.2 MIL/uL (ref 3.87–5.11)
RDW: 13.2 % (ref 11.5–15.5)
WBC: 7.4 10*3/uL (ref 4.0–10.5)
nRBC: 0 % (ref 0.0–0.2)

## 2018-06-30 LAB — BASIC METABOLIC PANEL
Anion gap: 6 (ref 5–15)
BUN: 6 mg/dL (ref 6–20)
CO2: 28 mmol/L (ref 22–32)
Calcium: 9 mg/dL (ref 8.9–10.3)
Chloride: 104 mmol/L (ref 98–111)
Creatinine, Ser: 0.89 mg/dL (ref 0.44–1.00)
GFR calc Af Amer: >60 mL/min (ref >60)
GFR calc non Af Amer: >60 mL/min (ref >60)
Glucose, Bld: 98 mg/dL (ref 70–99)
Potassium: 4.1 mmol/L (ref 3.5–5.1)
Sodium: 138 mmol/L (ref 135–145)

## 2018-06-30 LAB — PREGNANCY, URINE: Preg Test, Ur: NEGATIVE

## 2018-06-30 LAB — POCT ACTIVATED CLOTTING TIME: Activated Clotting Time: 114 seconds

## 2018-06-30 SURGERY — PVC ABLATION
Anesthesia: General

## 2018-06-30 MED ORDER — KETOROLAC TROMETHAMINE 30 MG/ML IJ SOLN
15.0000 mg | Freq: Once | INTRAMUSCULAR | Status: AC
  Administered 2018-06-30: 18:00:00 15 mg via INTRAVENOUS
  Filled 2018-06-30: qty 1

## 2018-06-30 MED ORDER — SODIUM CHLORIDE 0.9% FLUSH: 3.0000 mL | INTRAVENOUS | Status: DC | PRN

## 2018-06-30 MED ORDER — HEPARIN (PORCINE) IN NACL 1000-0.9 UT/500ML-% IV SOLN
INTRAVENOUS | Status: DC | PRN
  Administered 2018-06-30 (×2): 500 mL

## 2018-06-30 MED ORDER — ONDANSETRON HCL 4 MG/2ML IJ SOLN
INTRAMUSCULAR | Status: DC | PRN
  Administered 2018-06-30: 4 mg via INTRAVENOUS

## 2018-06-30 MED ORDER — ACETAMINOPHEN 325 MG PO TABS: 650.0000 mg | ORAL_TABLET | ORAL | Status: DC | PRN

## 2018-06-30 MED ORDER — PROPOFOL 10 MG/ML IV BOLUS
INTRAVENOUS | Status: DC | PRN
  Administered 2018-06-30: 150 mg via INTRAVENOUS

## 2018-06-30 MED ORDER — SUGAMMADEX SODIUM 200 MG/2ML IV SOLN
INTRAVENOUS | Status: DC | PRN
  Administered 2018-06-30: 200 mg via INTRAVENOUS

## 2018-06-30 MED ORDER — SODIUM CHLORIDE 0.9 % IV SOLN: 250.0000 mL | INTRAVENOUS | Status: DC | PRN

## 2018-06-30 MED ORDER — HEPARIN SODIUM (PORCINE) 1000 UNIT/ML IJ SOLN
INTRAMUSCULAR | Status: DC | PRN
  Administered 2018-06-30: 10000 [IU] via INTRAVENOUS

## 2018-06-30 MED ORDER — SODIUM CHLORIDE 0.9% FLUSH: 3.0000 mL | Freq: Two times a day (BID) | INTRAVENOUS | Status: DC

## 2018-06-30 MED ORDER — HEPARIN (PORCINE) IN NACL 1000-0.9 UT/500ML-% IV SOLN
INTRAVENOUS | Status: AC
  Filled 2018-06-30: qty 500

## 2018-06-30 MED ORDER — ROCURONIUM BROMIDE 10 MG/ML (PF) SYRINGE
PREFILLED_SYRINGE | INTRAVENOUS | Status: DC | PRN
  Administered 2018-06-30: 40 mg via INTRAVENOUS
  Administered 2018-06-30 (×2): 10 mg via INTRAVENOUS
  Administered 2018-06-30: 20 mg via INTRAVENOUS

## 2018-06-30 MED ORDER — PROMETHAZINE HCL 25 MG/ML IJ SOLN: 6.2500 mg | INTRAMUSCULAR | Status: DC | PRN

## 2018-06-30 MED ORDER — HEPARIN SODIUM (PORCINE) 1000 UNIT/ML IJ SOLN
INTRAMUSCULAR | Status: DC | PRN
  Administered 2018-06-30: 1000 [IU] via INTRAVENOUS

## 2018-06-30 MED ORDER — SODIUM CHLORIDE 0.9 % IV SOLN
INTRAVENOUS | Status: DC
  Administered 2018-06-30: 06:00:00 via INTRAVENOUS

## 2018-06-30 MED ORDER — FENTANYL CITRATE (PF) 100 MCG/2ML IJ SOLN
INTRAMUSCULAR | Status: DC | PRN
  Administered 2018-06-30: 100 ug via INTRAVENOUS

## 2018-06-30 MED ORDER — OXYCODONE HCL 5 MG/5ML PO SOLN: 5.0000 mg | Freq: Once | ORAL | Status: DC | PRN

## 2018-06-30 MED ORDER — BUPIVACAINE HCL (PF) 0.25 % IJ SOLN
INTRAMUSCULAR | Status: DC | PRN
  Administered 2018-06-30: 30 mL

## 2018-06-30 MED ORDER — MEPERIDINE HCL 25 MG/ML IJ SOLN: 6.2500 mg | INTRAMUSCULAR | Status: DC | PRN

## 2018-06-30 MED ORDER — SUCCINYLCHOLINE CHLORIDE 200 MG/10ML IV SOSY
PREFILLED_SYRINGE | INTRAVENOUS | Status: DC | PRN
  Administered 2018-06-30: 120 mg via INTRAVENOUS

## 2018-06-30 MED ORDER — BUPIVACAINE HCL (PF) 0.25 % IJ SOLN
INTRAMUSCULAR | Status: AC
  Filled 2018-06-30: qty 60

## 2018-06-30 MED ORDER — PROTAMINE SULFATE 10 MG/ML IV SOLN
INTRAVENOUS | Status: DC | PRN
  Administered 2018-06-30: 10 mg via INTRAVENOUS
  Administered 2018-06-30: 20 mg via INTRAVENOUS
  Administered 2018-06-30: 10 mg via INTRAVENOUS

## 2018-06-30 MED ORDER — LIDOCAINE 2% (20 MG/ML) 5 ML SYRINGE
INTRAMUSCULAR | Status: DC | PRN
  Administered 2018-06-30: 80 mg via INTRAVENOUS

## 2018-06-30 MED ORDER — ONDANSETRON HCL 4 MG/2ML IJ SOLN: 4.0000 mg | Freq: Four times a day (QID) | INTRAMUSCULAR | Status: DC | PRN

## 2018-06-30 MED ORDER — MIDAZOLAM HCL 5 MG/5ML IJ SOLN
INTRAMUSCULAR | Status: DC | PRN
  Administered 2018-06-30: 2 mg via INTRAVENOUS

## 2018-06-30 MED ORDER — DEXAMETHASONE SODIUM PHOSPHATE 10 MG/ML IJ SOLN
INTRAMUSCULAR | Status: DC | PRN
  Administered 2018-06-30: 10 mg via INTRAVENOUS

## 2018-06-30 MED ORDER — HYDROMORPHONE HCL 1 MG/ML IJ SOLN: 0.2500 mg | INTRAMUSCULAR | Status: DC | PRN

## 2018-06-30 MED ORDER — MEXILETINE HCL 150 MG PO CAPS: 150.0000 mg | ORAL_CAPSULE | Freq: Two times a day (BID) | ORAL | 1 refills | Status: DC

## 2018-06-30 MED ORDER — OXYCODONE HCL 5 MG PO TABS: 5.0000 mg | ORAL_TABLET | Freq: Once | ORAL | Status: DC | PRN

## 2018-06-30 MED ORDER — HEPARIN SODIUM (PORCINE) 1000 UNIT/ML IJ SOLN
INTRAMUSCULAR | Status: AC
  Filled 2018-06-30: qty 1

## 2018-06-30 MED ORDER — KETOROLAC TROMETHAMINE 30 MG/ML IJ SOLN: 30.0000 mg | Freq: Once | INTRAMUSCULAR | Status: DC

## 2018-06-30 SURGICAL SUPPLY — 16 items
BLANKET WARM UNDERBOD FULL ACC (MISCELLANEOUS) ×3 IMPLANT
CATH JOSEPHSON QUAD-ALLRED 6FR (CATHETERS) ×3 IMPLANT
CATH SMTCH THERMOCOOL SF DF (CATHETERS) ×3 IMPLANT
CATH SOUNDSTAR ECO REPROCESSED (CATHETERS) ×3 IMPLANT
COVER SWIFTLINK CONNECTOR (BAG) ×3 IMPLANT
PACK EP LATEX FREE (CUSTOM PROCEDURE TRAY) ×2
PACK EP LF (CUSTOM PROCEDURE TRAY) ×1 IMPLANT
PAD PRO RADIOLUCENT 2001M-C (PAD) ×3 IMPLANT
PATCH CARTO3 (PAD) ×3 IMPLANT
SHEATH AVANTI 11F 11CM (SHEATH) ×3 IMPLANT
SHEATH BAYLIS SUREFLEX  M 8.5 (SHEATH) ×2
SHEATH BAYLIS SUREFLEX M 8.5 (SHEATH) ×1 IMPLANT
SHEATH PINNACLE 6F 10CM (SHEATH) ×3 IMPLANT
SHEATH PINNACLE 8F 10CM (SHEATH) ×6 IMPLANT
SHEATH PINNACLE VASC 9FR (SHEATH) ×3 IMPLANT
TUBING SMART ABLATE COOLFLOW (TUBING) ×3 IMPLANT

## 2018-06-30 NOTE — Progress Notes (Signed)
Site area: rt groin fv sheaths x3 Site Prior to Removal:  Level 0 Pressure Applied For:  20 minutes Manual:   yes Patient Status During Pull:  stable Post Pull Site:  Level 0 Post Pull Instructions Given:  yes Post Pull Pulses Present: rt dp palpable Dressing Applied:  Gauze and tegaderm Bedrest begins @  Comments:

## 2018-06-30 NOTE — Anesthesia Preprocedure Evaluation (Signed)
Anesthesia Evaluation  Patient identified by MRN, date of birth, ID band Patient awake    Reviewed: Allergy & Precautions, H&P , NPO status , Patient's Chart, lab work & pertinent test results  Airway Mallampati: II  TM Distance: >3 FB Neck ROM: full    Dental no notable dental hx.    Pulmonary neg pulmonary ROS, asthma ,    Pulmonary exam normal breath sounds clear to auscultation       Cardiovascular + dysrhythmias  Rhythm:regular Rate:Normal     Neuro/Psych negative neurological ROS  negative psych ROS   GI/Hepatic negative GI ROS, Neg liver ROS,   Endo/Other  negative endocrine ROS  Renal/GU negative Renal ROS  negative genitourinary   Musculoskeletal negative musculoskeletal ROS (+)   Abdominal (+) + obese,   Peds negative pediatric ROS (+)  Hematology negative hematology ROS (+)   Anesthesia Other Findings   Reproductive/Obstetrics negative OB ROS (+) Pregnancy                             Anesthesia Physical  Anesthesia Plan  ASA: II  Anesthesia Plan: General   Post-op Pain Management:    Induction: Intravenous  PONV Risk Score and Plan: 3 and Ondansetron, Dexamethasone, Midazolam and Treatment may vary due to age or medical condition  Airway Management Planned: Oral ETT  Additional Equipment:   Intra-op Plan:   Post-operative Plan: Extubation in OR  Informed Consent: I have reviewed the patients History and Physical, chart, labs and discussed the procedure including the risks, benefits and alternatives for the proposed anesthesia with the patient or authorized representative who has indicated his/her understanding and acceptance.     Dental advisory given  Plan Discussed with: CRNA  Anesthesia Plan Comments:         Anesthesia Quick Evaluation

## 2018-06-30 NOTE — Anesthesia Procedure Notes (Signed)
Procedure Name: Intubation Date/Time: 06/30/2018 7:45 AM Performed by: Jenne Campus, CRNA Pre-anesthesia Checklist: Patient identified, Emergency Drugs available, Suction available and Patient being monitored Patient Re-evaluated:Patient Re-evaluated prior to induction Oxygen Delivery Method: Circle System Utilized Preoxygenation: Pre-oxygenation with 100% oxygen Induction Type: IV induction Laryngoscope Size: Miller and 2 Grade View: Grade I Tube type: Oral Tube size: 7.5 mm Number of attempts: 1 Airway Equipment and Method: Stylet Placement Confirmation: ETT inserted through vocal cords under direct vision,  positive ETCO2 and breath sounds checked- equal and bilateral Secured at: 21 cm Tube secured with: Tape Dental Injury: Teeth and Oropharynx as per pre-operative assessment

## 2018-06-30 NOTE — Progress Notes (Signed)
Client c/o substernal chest pain 4/10 and through to back; Dr Curt Bears notified and order noted

## 2018-06-30 NOTE — H&P (Signed)
[name] has presented today for surgery, with the diagnosis of PVC.  The various methods of treatment have been discussed with the patient and family. After consideration of risks, benefits and other options for treatment, the patient has consented to  Procedure(s): Catheter ablation as a surgical intervention .  Risks include but not limited to bleeding, tamponade, heart block, stroke, damage to surrounding organs, among others. The patient's history has been reviewed, patient examined, no change in status, stable for surgery.  I have reviewed the patient's chart and labs.  Questions were answered to the patient's satisfaction.    Will Curt Bears, MD 06/30/2018 7:11 AM

## 2018-06-30 NOTE — Progress Notes (Signed)
Up and walked and tolerated well; bilat groins stable, no bleeding or hematoma 

## 2018-06-30 NOTE — Discharge Instructions (Signed)
Femoral Site Care °This sheet gives you information about how to care for yourself after your procedure. Your health care provider may also give you more specific instructions. If you have problems or questions, contact your health care provider. °What can I expect after the procedure? °After the procedure, it is common to have: °· Bruising that usually fades within 1-2 weeks. °· Tenderness at the site. °Follow these instructions at home: °Wound care °· Follow instructions from your health care provider about how to take care of your insertion site. Make sure you: °? Wash your hands with soap and water before you change your bandage (dressing). If soap and water are not available, use hand sanitizer. °? Change your dressing as told by your health care provider. °? Leave stitches (sutures), skin glue, or adhesive strips in place. These skin closures may need to stay in place for 2 weeks or longer. If adhesive strip edges start to loosen and curl up, you may trim the loose edges. Do not remove adhesive strips completely unless your health care provider tells you to do that. °· Do not take baths, swim, or use a hot tub until your health care provider approves. °· You may shower 24-48 hours after the procedure or as told by your health care provider. °? Gently wash the site with plain soap and water. °? Pat the area dry with a clean towel. °? Do not rub the site. This may cause bleeding. °· Do not apply powder or lotion to the site. Keep the site clean and dry. °· Check your femoral site every day for signs of infection. Check for: °? Redness, swelling, or pain. °? Fluid or blood. °? Warmth. °? Pus or a bad smell. °Activity °· For the first 2-3 days after your procedure, or as long as directed: °? Avoid climbing stairs as much as possible. °? Do not squat. °· Do not lift anything that is heavier than 10 lb (4.5 kg), or the limit that you are told, until your health care provider says that it is safe. °· Rest as  directed. °? Avoid sitting for a long time without moving. Get up to take short walks every 1-2 hours. °· Do not drive for 24 hours if you were given a medicine to help you relax (sedative). °General instructions °· Take over-the-counter and prescription medicines only as told by your health care provider. °· Keep all follow-up visits as told by your health care provider. This is important. °Contact a health care provider if you have: °· A fever or chills. °· You have redness, swelling, or pain around your insertion site. °Get help right away if: °· The catheter insertion area swells very fast. °· You pass out. °· You suddenly start to sweat or your skin gets clammy. °· The catheter insertion area is bleeding, and the bleeding does not stop when you hold steady pressure on the area. °· The area near or just beyond the catheter insertion site becomes pale, cool, tingly, or numb. °These symptoms may represent a serious problem that is an emergency. Do not wait to see if the symptoms will go away. Get medical help right away. Call your local emergency services (911 in the [place] Do not drive yourself to the hospital. °Summary °· After the procedure, it is common to have bruising that usually fades within 1-2 weeks. °· Check your femoral site every day for signs of infection. °· Do not lift anything that is heavier than 10 lb (4.5 kg), or the   limit that you are told, until your health care provider says that it is safe. °This information is not intended to replace advice given to you by your health care provider. Make sure you discuss any questions you have with your health care provider. °Document Released: 09/01/2013 Document Revised: 01/11/2017 Document Reviewed: 01/11/2017 °Elsevier Interactive Patient Education © 2019 [employer] ° °

## 2018-06-30 NOTE — Transfer of Care (Signed)
Immediate Anesthesia Transfer of Care Note  Patient: [name]  Procedure(s) Performed: PVC ABLATION (N/A )  Patient Location: Cath Lab  Anesthesia Type:General  Level of Consciousness: awake, oriented and patient cooperative  Airway & Oxygen Therapy: Patient Spontanous Breathing and Patient connected to nasal cannula oxygen  Post-op Assessment: Report given to RN and Post -op Vital signs reviewed and stable  Post vital signs: Reviewed  Last Vitals:  Vitals Value Taken Time  BP 124/61 06/30/18 1140  Temp 36.5 C 06/30/18 1131  Pulse 82 06/30/18 1143  Resp 17 06/30/18 1143  SpO2 96 % 06/30/18 1143  Vitals shown include unvalidated device data.  Last Pain:  Vitals:   06/30/18 1131  TempSrc: Temporal  PainSc: 0-No pain      Patients Stated Pain Goal: 2 (25/95/63 8756)  Complications: No apparent anesthesia complications

## 2018-06-30 NOTE — Anesthesia Postprocedure Evaluation (Signed)
Anesthesia Post Note  Patient: Kynsie Falkner Wenke  Procedure(s) Performed: PVC ABLATION (N/A )     Patient location during evaluation: PACU Anesthesia Type: General Level of consciousness: awake and alert Pain management: pain level controlled Vital Signs Assessment: post-procedure vital signs reviewed and stable Respiratory status: spontaneous breathing, nonlabored ventilation and respiratory function stable Cardiovascular status: blood pressure returned to baseline and stable Postop Assessment: no apparent nausea or vomiting Anesthetic complications: no    Last Vitals:  Vitals:   06/30/18 1322 06/30/18 1330  BP: 116/62 125/63  Pulse: 78   Resp: 16 18  Temp: (!) 36.3 C   SpO2:      Last Pain:  Vitals:   06/30/18 1322  TempSrc: Oral  PainSc:                  Lynda Rainwater

## 2018-06-30 NOTE — Progress Notes (Signed)
      8 Fr. R F/A sheath was pulled and manual pressure was held for 30 min. L groin area is soft and non tender.Sterile gauze was applied at the site.  L DP was palpable before and after the sheath pull. Bed rest started at 1250 X 6 hr. Instructions were given to patient about her bed rest time.   HR 60's Vent. Bigeminy   BP 110/49   sPO2 97% on R/A

## 2018-07-01 ENCOUNTER — Telehealth: Payer: Self-pay | Admitting: Cardiology

## 2018-07-01 ENCOUNTER — Encounter (HOSPITAL_COMMUNITY): Payer: Self-pay | Admitting: Cardiology

## 2018-07-01 NOTE — Telephone Encounter (Signed)
Advised pt to take both Mexiletine & Lopressor, per Dr. Curt Bears. Patient verbalized understanding and agreeable to plan.

## 2018-07-01 NOTE — Telephone Encounter (Signed)
  Ms Ocanas had a procedure yesterday and she was given mexiletine (MEXITIL) 150 MG capsule to take. Her question is if she is supposed to continue taking metoprolol tartrate (LOPRESSOR) 25 MG tablet or does she stop this one and only take the mexiletine

## 2018-07-03 ENCOUNTER — Telehealth: Payer: Self-pay | Admitting: Cardiology

## 2018-07-03 NOTE — Telephone Encounter (Signed)
   Outpatient After Clinic Hours Call  MD: Dr. Curt Bears   Brief Patient Profile: 38 y/o female recently admitted for SVT ablation on 06/30/18  CC: right leg tingling sensation   HPI: following SVT ablation, via femoral access, pt has intermittent tingling along the medial aspect of right leg, from groin down to great toe. She denies any significant pain with ambulation. Reports cath access site is soft with slight echymosis but non tender. Reports normal temp and color of extremities and has been following d/c activity restriction/ wound care instructions.   Recommendations: pt advised to continue to rest today and to avoid lots of walking, prolonged standing and no straining/ heavy lifting. Will monitor for swelling.   F/u: will route to Dr. Curt Bears and RN to review in the AM and will have RN f/u with call to reassess. Could consider OV w/ APP for exam and ultrasound if symptoms persists or worsen.

## 2018-07-04 NOTE — Telephone Encounter (Signed)
Followed up with pt. Advised to continue to monitor, per Dr. Curt Bears. Advised if swelling or pain begins to call office. Advised if no improvement by end of week to call to discuss. Patient verbalized understanding and agreeable to plan.

## 2018-07-27 ENCOUNTER — Telehealth: Payer: Self-pay | Admitting: Cardiology

## 2018-07-27 NOTE — Telephone Encounter (Signed)

## 2018-07-29 ENCOUNTER — Other Ambulatory Visit: Payer: Self-pay

## 2018-07-29 ENCOUNTER — Encounter: Payer: Self-pay | Admitting: Cardiology

## 2018-07-29 ENCOUNTER — Ambulatory Visit (INDEPENDENT_AMBULATORY_CARE_PROVIDER_SITE_OTHER): Payer: BC Managed Care – PPO | Admitting: Cardiology

## 2018-07-29 VITALS — BP 116/80 | HR 60 | Ht 66.0 in | Wt 206.0 lb

## 2018-07-29 DIAGNOSIS — I493 Ventricular premature depolarization: Secondary | ICD-10-CM

## 2018-07-29 NOTE — Patient Instructions (Signed)
Medication Instructions:  Your physician has recommended you make the following change in your medication:  1. STOP Metoprolol  * If you need a refill on your cardiac medications before your next appointment, please call your pharmacy.   Labwork: None ordered  Testing/Procedures: Your physician has requested that you have an echocardiogram in 6 months. Echocardiography is a painless test that uses sound waves to create images of your heart. It provides your doctor with information about the size and shape of your heart and how well your heart's chambers and valves are working. This procedure takes approximately one hour. There are no restrictions for this procedure.   Follow-Up: Your physician wants you to follow-up in: 6 months with Dr. Curt Bears (after you have completed echocardiogram).  You will receive a reminder letter in the mail two months in advance. If you don't receive a letter, please call our office to schedule the follow-up appointment.   Thank you for choosing [facility]!!   Trinidad Curet, RN [phone]

## 2018-07-29 NOTE — Progress Notes (Signed)
PCP:  Gordy Clement., PA-C Primary Cardiologist: No primary care provider on file. Electrophysiologist: Will [name], MD   [name] is a 38 y.o. 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 Pt had attempted PVC ablation 06/30/2018 but ultimately we were unable to ablate her PVC. Started on mexiletine. She presents today for routine electrophysiology followup. She is feeling "much, much better". She has had none of her previous fatigue or SOB since the procedure and medication adjustment.   The patient feels that she is tolerating medications without difficulties and is otherwise without complaint today. She denies symptoms of palpitations, chest pain, shortness of breath, orthopnea, PND, lower extremity edema, claudication, dizziness, presyncope, syncope, bleeding, or neurologic sequela. The patient is tolerating medications without difficulties.    Past Medical History:  Diagnosis Date  . Asthma   . Chlamydia    Past Surgical History:  Procedure Laterality Date  . Cyst removal rt ear    . PVC ABLATION N/A 06/30/2018   Procedure: PVC ABLATION;  Surgeon: Regan Lemming, MD;  Location: [facility];  Service: Cardiovascular;  Laterality: N/A;    Current Outpatient Medications  Medication Sig Dispense Refill  . albuterol (PROVENTIL HFA) 108 (90 BASE) MCG/ACT inhaler Inhale 2 puffs into the lungs every 6 (six) hours as needed for wheezing. For shortness of breath or wheezing 1 Inhaler 2  . ibuprofen (ADVIL) 800 MG tablet Take 800 mg by mouth daily as needed (pain.).    Marland Kitchen metoprolol tartrate (LOPRESSOR) 25 MG tablet Take 1 tablet (25 mg total) by mouth 2 (two) times daily. 180 tablet 2  . mexiletine (MEXITIL) 150 MG capsule Take 1 capsule (150 mg total) by mouth 2 (two) times daily. 60 capsule 1   No current facility-administered medications for this visit.     No Known Allergies  Social History    Socioeconomic History  . Marital status: Married    Spouse name: Not on file  . Number of children: 2  . Years of education: Not on file  . Highest education level: Not on file  Occupational History  . Not on file  Social Needs  . Financial resource strain: Not on file  . Food insecurity    Worry: Not on file    Inability: Not on file  . Transportation needs    Medical: Not on file    Non-medical: Not on file  Tobacco Use  . Smoking status: Never Smoker  . Smokeless tobacco: Never Used  Substance and Sexual Activity  . Alcohol use: Yes    Comment: occ  . Drug use: No  . Sexual activity: Yes    Birth control/protection: Pill    Comment: camilla  Lifestyle  . Physical activity    Days per week: Not on file    Minutes per session: Not on file  . Stress: Not on file  Relationships  . Social Musician on phone: Not on file    Gets together: Not on file    Attends religious service: Not on file    Active member of club or organization: Not on file    Attends meetings of clubs or organizations: Not on file    Relationship status: Not on file  . Intimate partner violence    Fear of current or ex partner: Not on file    Emotionally abused: Not on file    Physically abused:  Not on file    Forced sexual activity: Not on file  Other Topics Concern  . Not on file  Social History Narrative  . Not on file     Review of Systems: General: No chills, fever, night sweats or weight changes  Cardiovascular:  No chest pain, dyspnea on exertion, edema, orthopnea, palpitations, paroxysmal nocturnal dyspnea Dermatological: No rash, lesions or masses Respiratory: No cough, dyspnea Urologic: No hematuria, dysuria Abdominal: No nausea, vomiting, diarrhea, bright red blood per rectum, melena, or hematemesis Neurologic: No visual changes, weakness, changes in mental status All other systems reviewed and are otherwise negative except as noted above.  Physical  Exam: Vitals:   07/29/18 1447  BP: 116/80  Pulse: 60  Weight: 206 lb (93.4 kg)  Height: 5\' 6"  (1.676 m)    GEN- The patient is well appearing, alert and oriented x 3 today.   HEENT: normocephalic, atraumatic; sclera clear, conjunctiva pink; hearing intact; oropharynx clear; neck supple, no JVP Lymph- no cervical lymphadenopathy Lungs- Clear to ausculation bilaterally, normal work of breathing.  No wheezes, rales, rhonchi Heart- Regular rate and rhythm, no murmurs, rubs or gallops, PMI not laterally displaced GI- soft, non-tender, non-distended, bowel sounds present, no hepatosplenomegaly Extremities- no clubbing, cyanosis, or edema; DP/PT/radial pulses 2+ bilaterally MS- no significant deformity or atrophy Skin- warm and dry, no rash or lesion Psych- euthymic mood, full affect Neuro- strength and sensation are intact  EKG is ordered today. Personal review shows sinus bradycardia at 51 bpm with occasional PVCs.   Assessment and Plan:  1. Frequent PVCs - Much improved on EKG today. Symptomatically she is much better on Mexitil. Will stop Toprol and see how she does.   2. PVC induced cardiomyopathy - Will repeat Echo in 6 months prior to f/u with suppression of her PVCs.  Leah Friar, PA-C  07/29/18 2:47 PM  I have seen and examined this patient with Oda Kilts.  Agree with above, note added to reflect my findings.  On exam, RRR, no murmurs, lungs clear.  Status post PVC ablation.  Unfortunately she continued to have PVCs post ablation.  She was put on mexiletine.  Since being started on mexiletine she has felt much improved without weakness fatigue or shortness of breath.  We will plan to see her back in 6 months with an echo at that time.  Will M. Camnitz MD 07/29/2018 3:10 PM

## 2018-08-28 ENCOUNTER — Other Ambulatory Visit: Payer: Self-pay | Admitting: Nurse Practitioner

## 2018-10-03 ENCOUNTER — Telehealth: Payer: Self-pay | Admitting: Cardiology

## 2018-10-03 NOTE — Telephone Encounter (Signed)
I spoke to the patient who described her symptoms to me of "chest tightness when taking a breath" and I recommended that she reach out to [facility] for further evaluation.  They have been her established cardiologist team.  Dr Curt Bears is her EP doctor..  She verbalized understanding.

## 2018-10-03 NOTE — Telephone Encounter (Signed)
  Patient is calling because she has been having some chest tightness for the past week. It comes and goes and happens whether resting or on exertion. Denies SOB but does have increased pain if she takes a really deep breath. Denies any other symptoms. She is not sure if she should come in and be seen and would like to speak to a nurse.

## 2019-01-18 ENCOUNTER — Ambulatory Visit (HOSPITAL_COMMUNITY): Payer: BC Managed Care – PPO | Attending: Cardiovascular Disease

## 2019-01-18 ENCOUNTER — Other Ambulatory Visit: Payer: Self-pay

## 2019-01-18 DIAGNOSIS — I493 Ventricular premature depolarization: Secondary | ICD-10-CM | POA: Diagnosis present

## 2019-02-02 NOTE — Progress Notes (Signed)
Electrophysiology Office Note Date: 02/02/2019  ID:  [name], DOB [birth year], MRN [id number]  PCP: Estevan Ryder, PA-C Electrophysiologist: Curt Bears  CC: PVC follow up  [name] is a 39 y.o. female seen today for Dr Curt Bears.  She presents today for routine electrophysiology followup.  Since last being seen in our clinic, the patient reports doing very well. She is a Oncologist. She has some palpitations that can be triggered by caffeine and are associated with fatigue.  She denies chest pain, dyspnea, PND, orthopnea, nausea, vomiting, dizziness, syncope, edema, weight gain, or early satiety.  Past Medical History:  Diagnosis Date  . Asthma   . Chlamydia    Past Surgical History:  Procedure Laterality Date  . Cyst removal rt ear    . PVC ABLATION N/A 06/30/2018   Procedure: PVC ABLATION;  Surgeon: Constance Haw, MD;  Location: [facility];  Service: Cardiovascular;  Laterality: N/A;    Current Outpatient Medications  Medication Sig Dispense Refill  . albuterol (PROVENTIL HFA) 108 (90 BASE) MCG/ACT inhaler Inhale 2 puffs into the lungs every 6 (six) hours as needed for wheezing. For shortness of breath or wheezing 1 Inhaler 2  . ibuprofen (ADVIL) 800 MG tablet Take 800 mg by mouth daily as needed (pain.).    Marland Kitchen mexiletine (MEXITIL) 150 MG capsule TAKE 1 CAPSULE BY MOUTH TWICE A DAY 180 capsule 3   No current facility-administered medications for this visit.    Allergies:   Patient has no known allergies.   Social History: Social History   Socioeconomic History  . Marital status: Married    Spouse name: Not on file  . Number of children: 2  . Years of education: Not on file  . Highest education level: Not on file  Occupational History  . Not on file  Tobacco Use  . Smoking status: Never Smoker  . Smokeless tobacco: Never Used  Substance and Sexual Activity  . Alcohol use: Yes    Comment: occ  . Drug use: No  . Sexual activity:  Yes    Birth control/protection: Pill    Comment: camilla  Other Topics Concern  . Not on file  Social History Narrative  . Not on file   Social Determinants of Health   Financial Resource Strain:   . Difficulty of Paying Living Expenses: Not on file  Food Insecurity:   . Worried About Charity fundraiser in the Last Year: Not on file  . Ran Out of Food in the Last Year: Not on file  Transportation Needs:   . Lack of Transportation (Medical): Not on file  . Lack of Transportation (Non-Medical): Not on file  Physical Activity:   . Days of Exercise per Week: Not on file  . Minutes of Exercise per Session: Not on file  Stress:   . Feeling of Stress : Not on file  Social Connections:   . Frequency of Communication with Friends and Family: Not on file  . Frequency of Social Gatherings with Friends and Family: Not on file  . Attends Religious Services: Not on file  . Active Member of Clubs or Organizations: Not on file  . Attends Archivist Meetings: Not on file  . Marital Status: Not on file  Intimate Partner Violence:   . Fear of Current or Ex-Partner: Not on file  . Emotionally Abused: Not on file  . Physically Abused: Not on file  . Sexually Abused: Not  on file    Family History: Family History  Problem Relation Age of Onset  . Hypertension Mother   . Cancer Mother        leukemia  . Hypertension Brother   . Hypertension Maternal Grandmother   . Heart attack Maternal Grandmother 60       CABG  . Cancer Paternal Grandmother        breast  . Diabetes Paternal Grandmother   . Heart attack Father 37    Review of Systems: All other systems reviewed and are otherwise negative except as noted above.   Physical Exam: VS:  There were no vitals taken for this visit. , BMI There is no height or weight on file to calculate BMI. Wt Readings from Last 3 Encounters:  07/29/18 206 lb (93.4 kg)  06/30/18 210 lb (95.3 kg)  06/02/18 211 lb (95.7 kg)    GEN- The  patient is well appearing, alert and oriented x 3 today.   HEENT: normocephalic, atraumatic; sclera clear, conjunctiva pink; hearing intact; oropharynx clear; neck supple, no JVP Lymph- no cervical lymphadenopathy Lungs- Clear to ausculation bilaterally, normal work of breathing.  No wheezes, rales, rhonchi Heart- Regular rate and rhythm, no murmurs, rubs or gallops, PMI not laterally displaced GI- soft, non-tender, non-distended, bowel sounds present, no hepatosplenomegaly Extremities- no clubbing, cyanosis, or edema; DP/PT/radial pulses 2+ bilaterally MS- no significant deformity or atrophy Skin- warm and dry, no rash or lesion  Psych- euthymic mood, full affect Neuro- strength and sensation are intact   EKG:  EKG is not ordered today.  Recent Labs: 06/30/2018: BUN 6; Creatinine, Ser 0.89; Hemoglobin 13.2; Platelets 391; Potassium 4.1; Sodium 138    Other studies Reviewed: Additional studies/ records that were reviewed today include: Dr Elberta Fortis' office notes  Assessment and Plan:  1.  PVC's  Stable by symptoms Continue current meds EPS with unsuccessful ablation 06/2018  2.  PVC induced cardiomyopathy Improved by recent echo  3.  Obesity Body mass index is 35.96 kg/m. We discussed weight watchers today    Current medicines are reviewed at length with the patient today.   The patient does not have concerns regarding her medicines.  The following changes were made today:  none  Labs/ tests ordered today include: none No orders of the defined types were placed in this encounter.    Disposition:   Follow up with Dr Elberta Fortis 1 year    Signed, Gypsy Balsam, NP 02/02/2019 7:24 PM   [facility] [address] [place] [address] [phone] (office) [phone] (fax)

## 2019-02-03 ENCOUNTER — Ambulatory Visit: Payer: BC Managed Care – PPO | Admitting: Nurse Practitioner

## 2019-02-03 ENCOUNTER — Encounter: Payer: Self-pay | Admitting: Nurse Practitioner

## 2019-02-03 ENCOUNTER — Other Ambulatory Visit: Payer: Self-pay

## 2019-02-03 VITALS — BP 90/62 | HR 58 | Ht 66.0 in | Wt 222.8 lb

## 2019-02-03 DIAGNOSIS — I493 Ventricular premature depolarization: Secondary | ICD-10-CM

## 2019-02-03 DIAGNOSIS — I428 Other cardiomyopathies: Secondary | ICD-10-CM | POA: Diagnosis not present

## 2019-02-03 NOTE — Patient Instructions (Signed)
Medication Instructions:  none *If you need a refill on your cardiac medications before your next appointment, please call your pharmacy*  Lab Work: none If you have labs (blood work) drawn today and your tests are completely normal, you will receive your results only by: Marland Kitchen MyChart Message (if you have MyChart) OR . A paper copy in the mail If you have any lab test that is abnormal or we need to change your treatment, we will call you to review the results.  Testing/Procedures: none  Follow-Up: At [facility], you and your health needs are our priority.  As part of our continuing mission to provide you with exceptional heart care, we have created designated Provider Care Teams.  These Care Teams include your primary Cardiologist (physician) and Advanced Practice Providers (APPs -  Physician Assistants and Nurse Practitioners) who all work together to provide you with the care you need, when you need it.  Your next appointment:   1 year(s)  The format for your next appointment:   Either In Person or Virtual  Provider:   Dr Curt Bears  Other Instructions  Heart-Healthy Eating Plan Heart-healthy meal planning includes: Eating less unhealthy fats. Eating more healthy fats. Making other changes in your diet. Talk with your doctor or a diet specialist (dietitian) to create an eating plan that is right for you. What is my plan? Your doctor may recommend an eating plan that includes: Total fat: ______% or less of total calories a day. Saturated fat: ______% or less of total calories a day. Cholesterol: less than _________mg a day. What are tips for following this plan? Cooking Avoid frying your food. Try to bake, boil, grill, or broil it instead. You can also reduce fat by: Removing the skin from poultry. Removing all visible fats from meats. Steaming vegetables in water or broth. Meal planning  At meals, divide your plate into four equal parts: Fill one-half of your plate  with vegetables and green salads. Fill one-fourth of your plate with whole grains. Fill one-fourth of your plate with lean protein foods. Eat 4-5 servings of vegetables per day. A serving of vegetables is: 1 cup of raw or cooked vegetables. 2 cups of raw leafy greens. Eat 4-5 servings of fruit per day. A serving of fruit is: 1 medium whole fruit.  cup of dried fruit.  cup of fresh, frozen, or canned fruit.  cup of 100% fruit juice. Eat more foods that have soluble fiber. These are apples, broccoli, carrots, beans, peas, and barley. Try to get 20-30 g of fiber per day. Eat 4-5 servings of nuts, legumes, and seeds per week: 1 serving of dried beans or legumes equals  cup after being cooked. 1 serving of nuts is  cup. 1 serving of seeds equals 1 tablespoon. General information Eat more home-cooked food. Eat less restaurant, buffet, and fast food. Limit or avoid alcohol. Limit foods that are high in starch and sugar. Avoid fried foods. Lose weight if you are overweight. Keep track of how much salt (sodium) you eat. This is important if you have high blood pressure. Ask your doctor to tell you more about this. Try to add vegetarian meals each week. Fats Choose healthy fats. These include olive oil and canola oil, flaxseeds, walnuts, almonds, and seeds. Eat more omega-3 fats. These include salmon, mackerel, sardines, tuna, flaxseed oil, and ground flaxseeds. Try to eat fish at least 2 times each week. Check food labels. Avoid foods with trans fats or high amounts of saturated fat.  Limit saturated fats. These are often found in animal products, such as meats, butter, and cream. These are also found in plant foods, such as palm oil, palm kernel oil, and coconut oil. Avoid foods with partially hydrogenated oils in them. These have trans fats. Examples are stick margarine, some tub margarines, cookies, crackers, and other baked goods. What foods can I eat? Fruits All fresh, canned (in  natural juice), or frozen fruits. Vegetables Fresh or frozen vegetables (raw, steamed, roasted, or grilled). Green salads. Grains Most grains. Choose whole wheat and whole grains most of the time. Rice and pasta, including brown rice and pastas made with whole wheat. Meats and other proteins Lean, well-trimmed beef, veal, pork, and lamb. Chicken and [place] without skin. All fish and shellfish. Wild duck, rabbit, pheasant, and venison. Egg whites or low-cholesterol egg substitutes. Dried beans, peas, lentils, and tofu. Seeds and most nuts. Dairy Low-fat or nonfat cheeses, including ricotta and mozzarella. Skim or 1% milk that is liquid, powdered, or evaporated. Buttermilk that is made with low-fat milk. Nonfat or low-fat yogurt. Fats and oils Non-hydrogenated (trans-free) margarines. Vegetable oils, including soybean, sesame, sunflower, olive, peanut, safflower, corn, canola, and cottonseed. Salad dressings or mayonnaise made with a vegetable oil. Beverages Mineral water. Coffee and tea. Diet carbonated beverages. Sweets and desserts Sherbet, gelatin, and fruit ice. Small amounts of dark chocolate. Limit all sweets and desserts. Seasonings and condiments All seasonings and condiments. The items listed above may not be a complete list of foods and drinks you can eat. Contact a dietitian for more options. What foods should I avoid? Fruits Canned fruit in heavy syrup. Fruit in cream or butter sauce. Fried fruit. Limit coconut. Vegetables Vegetables cooked in cheese, cream, or butter sauce. Fried vegetables. Grains Breads that are made with saturated or trans fats, oils, or whole milk. Croissants. Sweet rolls. Donuts. High-fat crackers, such as cheese crackers. Meats and other proteins Fatty meats, such as hot dogs, ribs, sausage, bacon, rib-eye roast or steak. High-fat deli meats, such as salami and bologna. Caviar. Domestic duck and goose. Organ meats, such as liver. Dairy Cream, sour  cream, cream cheese, and creamed cottage cheese. Whole-milk cheeses. Whole or 2% milk that is liquid, evaporated, or condensed. Whole buttermilk. Cream sauce or high-fat cheese sauce. Yogurt that is made from whole milk. Fats and oils Meat fat, or shortening. Cocoa butter, hydrogenated oils, palm oil, coconut oil, palm kernel oil. Solid fats and shortenings, including bacon fat, salt pork, lard, and butter. Nondairy cream substitutes. Salad dressings with cheese or sour cream. Beverages Regular sodas and juice drinks with added sugar. Sweets and desserts Frosting. Pudding. Cookies. Cakes. Pies. Milk chocolate or white chocolate. Buttered syrups. Full-fat ice cream or ice cream drinks. The items listed above may not be a complete list of foods and drinks to avoid. Contact a dietitian for more information. Summary Heart-healthy meal planning includes eating less unhealthy fats, eating more healthy fats, and making other changes in your diet. Eat a balanced diet. This includes fruits and vegetables, low-fat or nonfat dairy, lean protein, nuts and legumes, whole grains, and heart-healthy oils and fats. This information is not intended to replace advice given to you by your health care provider. Make sure you discuss any questions you have with your health care provider. Document Revised: 03/04/2017 Document Reviewed: 02/05/2017 Elsevier Patient Education  2020 Elsevier Inc.  DASH Eating Plan DASH stands for "Dietary Approaches to Stop Hypertension." The DASH eating plan is a healthy eating plan that has  been shown to reduce high blood pressure (hypertension). It may also reduce your risk for type 2 diabetes, heart disease, and stroke. The DASH eating plan may also help with weight loss. What are tips for following this plan?  General guidelines  Avoid eating more than 2,300 mg (milligrams) of salt (sodium) a day. If you have hypertension, you may need to reduce your sodium intake to 1,500 mg a  day.  Limit alcohol intake to no more than 1 drink a day for nonpregnant women and 2 drinks a day for men. One drink equals 12 oz of beer, 5 oz of wine, or 1 oz of hard liquor.  Work with your health care provider to maintain a healthy body weight or to lose weight. Ask what an ideal weight is for you.  Get at least 30 minutes of exercise that causes your heart to beat faster (aerobic exercise) most days of the week. Activities may include walking, swimming, or biking.  Work with your health care provider or diet and nutrition specialist (dietitian) to adjust your eating plan to your individual calorie needs. Reading food labels   Check food labels for the amount of sodium per serving. Choose foods with less than 5 percent of the Daily Value of sodium. Generally, foods with less than 300 mg of sodium per serving fit into this eating plan.  To find whole grains, look for the word "whole" as the first word in the ingredient list. Shopping  Buy products labeled as "low-sodium" or "no salt added."  Buy fresh foods. Avoid canned foods and premade or frozen meals. Cooking  Avoid adding salt when cooking. Use salt-free seasonings or herbs instead of table salt or sea salt. Check with your health care provider or pharmacist before using salt substitutes.  Do not fry foods. Cook foods using healthy methods such as baking, boiling, grilling, and broiling instead.  Cook with heart-healthy oils, such as olive, canola, soybean, or sunflower oil. Meal planning  Eat a balanced diet that includes: ? 5 or more servings of fruits and vegetables each day. At each meal, try to fill half of your plate with fruits and vegetables. ? Up to 6-8 servings of whole grains each day. ? Less than 6 oz of lean meat, poultry, or fish each day. A 3-oz serving of meat is about the same size as a deck of cards. One egg equals 1 oz. ? 2 servings of low-fat dairy each day. ? A serving of nuts, seeds, or beans 5 times  each week. ? Heart-healthy fats. Healthy fats called Omega-3 fatty acids are found in foods such as flaxseeds and coldwater fish, like sardines, salmon, and mackerel.  Limit how much you eat of the following: ? Canned or prepackaged foods. ? Food that is high in trans fat, such as fried foods. ? Food that is high in saturated fat, such as fatty meat. ? Sweets, desserts, sugary drinks, and other foods with added sugar. ? Full-fat dairy products.  Do not salt foods before eating.  Try to eat at least 2 vegetarian meals each week.  Eat more home-cooked food and less restaurant, buffet, and fast food.  When eating at a restaurant, ask that your food be prepared with less salt or no salt, if possible. What foods are recommended? The items listed may not be a complete list. Talk with your dietitian about what dietary choices are best for you. Grains Whole-grain or whole-wheat bread. Whole-grain or whole-wheat pasta. Brown rice. Orpah Cobb. Bulgur.  Whole-grain and low-sodium cereals. Pita bread. Low-fat, low-sodium crackers. Whole-wheat flour tortillas. Vegetables Fresh or frozen vegetables (raw, steamed, roasted, or grilled). Low-sodium or reduced-sodium tomato and vegetable juice. Low-sodium or reduced-sodium tomato sauce and tomato paste. Low-sodium or reduced-sodium canned vegetables. Fruits All fresh, dried, or frozen fruit. Canned fruit in natural juice (without added sugar). Meat and other protein foods Skinless chicken or [place]. Ground chicken or [place]. Pork with fat trimmed off. Fish and seafood. Egg whites. Dried beans, peas, or lentils. Unsalted nuts, nut butters, and seeds. Unsalted canned beans. Lean cuts of beef with fat trimmed off. Low-sodium, lean deli meat. Dairy Low-fat (1%) or fat-free (skim) milk. Fat-free, low-fat, or reduced-fat cheeses. Nonfat, low-sodium ricotta or cottage cheese. Low-fat or nonfat yogurt. Low-fat, low-sodium cheese. Fats and oils Soft margarine  without trans fats. Vegetable oil. Low-fat, reduced-fat, or light mayonnaise and salad dressings (reduced-sodium). Canola, safflower, olive, soybean, and sunflower oils. Avocado. Seasoning and other foods Herbs. Spices. Seasoning mixes without salt. Unsalted popcorn and pretzels. Fat-free sweets. What foods are not recommended? The items listed may not be a complete list. Talk with your dietitian about what dietary choices are best for you. Grains Baked goods made with fat, such as croissants, muffins, or some breads. Dry pasta or rice meal packs. Vegetables Creamed or fried vegetables. Vegetables in a cheese sauce. Regular canned vegetables (not low-sodium or reduced-sodium). Regular canned tomato sauce and paste (not low-sodium or reduced-sodium). Regular tomato and vegetable juice (not low-sodium or reduced-sodium). Rosita Fire. Olives. Fruits Canned fruit in a light or heavy syrup. Fried fruit. Fruit in cream or butter sauce. Meat and other protein foods Fatty cuts of meat. Ribs. Fried meat. Tomasa Blase. Sausage. Bologna and other processed lunch meats. Salami. Fatback. Hotdogs. Bratwurst. Salted nuts and seeds. Canned beans with added salt. Canned or smoked fish. Whole eggs or egg yolks. Chicken or [place] with skin. Dairy Whole or 2% milk, cream, and half-and-half. Whole or full-fat cream cheese. Whole-fat or sweetened yogurt. Full-fat cheese. Nondairy creamers. Whipped toppings. Processed cheese and cheese spreads. Fats and oils Butter. Stick margarine. Lard. Shortening. Ghee. Bacon fat. Tropical oils, such as coconut, palm kernel, or palm oil. Seasoning and other foods Salted popcorn and pretzels. Onion salt, garlic salt, seasoned salt, table salt, and sea salt. Worcestershire sauce. Tartar sauce. Barbecue sauce. Teriyaki sauce. Soy sauce, including reduced-sodium. Steak sauce. Canned and packaged gravies. Fish sauce. Oyster sauce. Cocktail sauce. Horseradish that you find on the shelf. Ketchup.  Mustard. Meat flavorings and tenderizers. Bouillon cubes. Hot sauce and Tabasco sauce. Premade or packaged marinades. Premade or packaged taco seasonings. Relishes. Regular salad dressings. Where to find more information:  National Heart, Lung, and Blood Institute: PopSteam.is  American Heart Association: www.heart.org Summary  The DASH eating plan is a healthy eating plan that has been shown to reduce high blood pressure (hypertension). It may also reduce your risk for type 2 diabetes, heart disease, and stroke.  With the DASH eating plan, you should limit salt (sodium) intake to 2,300 mg a day. If you have hypertension, you may need to reduce your sodium intake to 1,500 mg a day.  When on the DASH eating plan, aim to eat more fresh fruits and vegetables, whole grains, lean proteins, low-fat dairy, and heart-healthy fats.  Work with your health care provider or diet and nutrition specialist (dietitian) to adjust your eating plan to your individual calorie needs. This information is not intended to replace advice given to you by your health care provider. Make sure  you discuss any questions you have with your health care provider. Document Revised: 12/11/2016 Document Reviewed: 12/23/2015 Elsevier Patient Education  2020 [employer].

## 2019-04-17 ENCOUNTER — Telehealth: Payer: Self-pay

## 2019-04-17 NOTE — Telephone Encounter (Signed)
I spoke to the patient and told her that we recommend the vaccine to all of our patients, without any restrictions.  She verbalized understanding.

## 2019-05-30 ENCOUNTER — Other Ambulatory Visit: Payer: Self-pay | Admitting: Obstetrics & Gynecology

## 2019-06-30 ENCOUNTER — Encounter (HOSPITAL_BASED_OUTPATIENT_CLINIC_OR_DEPARTMENT_OTHER): Payer: Self-pay | Admitting: Obstetrics & Gynecology

## 2019-06-30 ENCOUNTER — Other Ambulatory Visit: Payer: Self-pay

## 2019-06-30 NOTE — Progress Notes (Addendum)
Spoke w/ via phone for pre-op interview---patient Lab needs dos----urine preg, I stat 8           COVID test ------07-04-2019 at 855 Arrive at -------700 am 07-07-2019 NPO after ------midnight Medications to take morning of surgery -----albuterol inhaler prn/bring inhaler, patient states she cannot take mexiletine without food or it makers her sick Diabetic medication -----n/a Patient Special Instructions -----none Pre-Op special Istructions -----none Patient verbalized understanding of instructions that were given at this phone interview. Patient denies shortness of breath, chest pain, fever, cough a this phone interview.  Anesthesia : pvc's, , pvc induced cardiomyopathy please review 07-29-2018 ekg Chart to Carlen zanetto pa for review  DJS:[facility]FW[facility] Cardiologist : dr Elberta Fortis, lov dr Elberta Fortis 07-29-2018 epic, lov [facility] 02-03-2019 epic Chest x-ray :none EKG :07-29-2018 epic  Echo :01-18-2019 epic Cardiac Cath : none Activity level: can walk up flight of stairs without sob, does housework without problems Sleep Study/ CPAP :none Fasting Blood Sugar :      / Checks Blood Sugar -- times a day:  n/a Blood Thinner/ Instructions /Last Dose:n/a ASA / Instructions/ Last Dose : n/a

## 2019-07-04 ENCOUNTER — Other Ambulatory Visit (HOSPITAL_COMMUNITY)
Admission: RE | Admit: 2019-07-04 | Discharge: 2019-07-04 | Disposition: A | Discharge status: Home / Self Care | Payer: BC Managed Care – PPO | Source: Ambulatory Visit | Attending: Obstetrics & Gynecology | Admitting: Obstetrics & Gynecology

## 2019-07-04 DIAGNOSIS — Z20822 Contact with and (suspected) exposure to covid-19: Secondary | ICD-10-CM | POA: Diagnosis not present

## 2019-07-04 DIAGNOSIS — Z01812 Encounter for preprocedural laboratory examination: Secondary | ICD-10-CM | POA: Insufficient documentation

## 2019-07-04 LAB — SARS CORONAVIRUS 2 (TAT 6-24 HRS): SARS Coronavirus 2: NEGATIVE

## 2019-07-05 ENCOUNTER — Encounter (HOSPITAL_BASED_OUTPATIENT_CLINIC_OR_DEPARTMENT_OTHER): Payer: Self-pay | Admitting: Obstetrics & Gynecology

## 2019-07-06 ENCOUNTER — Other Ambulatory Visit: Payer: Self-pay | Admitting: Obstetrics & Gynecology

## 2019-07-06 NOTE — Progress Notes (Signed)
Chart reviewed by anesthesia, Jodell Cipro PA, stated ef 50-55% meets surgery center guidelines.

## 2019-07-06 NOTE — H&P (Signed)
[name] is an 39 y.o. female para 2 who desires permanent sterilization via laparoscopic bilateral fulguration.   Pertinent Gynecological History: Menses: flow is moderate Contraception: condoms DES exposure: unknown Blood transfusions: none Sexually transmitted diseases: no past history Previous GYN Procedures: None  Last mammogram: Not applicable.   Last pap: normal Date: 01/2015: Neg/neg OB History: G2, P2002   Menstrual History: Patient's last menstrual period was 06/23/2019.    Past Medical History:  Diagnosis Date  . Asthma   . 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 Chlamydia     Past Surgical History:  Procedure Laterality Date  . Cyst removal rt ear  yrs ago  . PVC ABLATION N/A 06/30/2018   Procedure: PVC ABLATION;  Surgeon: Regan Lemming, MD;  Location: [facility] INVASIVE CV LAB;  Service: Cardiovascular;  Laterality: N/A;    Family History  Problem Relation Age of Onset  . Hypertension Mother   . Cancer Mother        leukemia  . Hypertension Brother   . Hypertension Maternal Grandmother   . Heart attack Maternal Grandmother 60       CABG  . Cancer Paternal Grandmother        breast  . Diabetes Paternal Grandmother   . Heart attack Father 43    Social History:  reports that she has never smoked. She has never used smokeless tobacco. She reports current alcohol use. She reports that she does not use drugs.  Allergies: No Known Allergies  No medications prior to admission.    Review of Systems  HENT: Tinnitus:       Constitutional: Denies fevers/chills Cardiovascular: Denies chest pain or palpitations Pulmonary: Denies coughing or wheezing Gastrointestinal: Denies nausea, vomiting or diarrhea Genitourinary: Denies pelvic pain, unusual vaginal bleeding, unusual vaginal discharge, dysuria, urgency or frequency.  Musculoskeletal: Denies muscle or joint aches and pain.   Neurology: Denies abnormal sensations such as tingling or numbness.   Height 5\' 6"  (1.676 m), weight 102.1 kg, last menstrual period 06/23/2019.  BMI 36.32 Physical Exam   Constitutional: She is oriented to person, place, and time. She appears well-developed and well-nourished.  HENT:  Head: Normocephalic and atraumatic.  Neck: Normal range of motion.  Cardiovascular: Normal rate, regular rhythm and normal heart sounds.   Respiratory: Effort normal and breath sounds normal.  GI: Soft. Bowel sounds are normal.  Neurological: She is alert and oriented to person, place, and time.  Skin: Skin is warm and dry.  Psychiatric: She has a normal mood and affect. Her behavior is normal.    Pregnancy, urine POC Order: 08/23/2019 Status:  Final result Visible to patient:  No (scheduled for 07/07/2019 8:14 AM) Next appt:  None  0 Result Notes  Ref Range & Units 07:11 1 yr ago 7 yr ago  Preg Test, Ur NEGATIVE NEGATIVE  NEGATIVE CM  Negative R   Comment:     THE SENSITIVITY OF THIS  METHODOLOGY IS >24 mIU/mL        Recent Results (from the past 2160 hour(s))  SARS CORONAVIRUS 2 (TAT 6-24 HRS) Nasopharyngeal Nasopharyngeal Swab     Status: None   Collection Time: 07/04/19  9:10 AM   Specimen: Nasopharyngeal Swab  Result Value Ref Range   SARS Coronavirus 2 NEGATIVE NEGATIVE    Comment: (NOTE) SARS-CoV-2 target nucleic acids are NOT DETECTED.  The SARS-CoV-2 RNA is generally detectable in upper and  lower respiratory specimens during the acute phase of infection. Negative results do not preclude SARS-CoV-2 infection, do not rule out co-infections with other pathogens, and should not be used as the sole basis for treatment or other patient management decisions. Negative results must be combined with clinical observations, patient history, and epidemiological information. The expected result is Negative.  Fact Sheet for Patients: SugarRoll.be  Fact  Sheet for Healthcare Providers: https://www.woods-mathews.com/  This test is not yet approved or cleared by the [place] FDA and  has been authorized for detection and/or diagnosis of SARS-CoV-2 by FDA under an Emergency Use Authorization (EUA). This EUA will remain  in effect (meaning this test can be used) for the duration of the COVID-19 declaration under Se ction 564(b)(1) of the Act, 21 U.S.C. section 360bbb-3(b)(1), unless the authorization is terminated or revoked sooner.  Performed at [facility], [place] [address] [place], [place] [address]   Pregnancy, urine POC     Status: None   Collection Time: 07/07/19  7:11 AM  Result Value Ref Range   Preg Test, Ur NEGATIVE NEGATIVE    Comment:        THE SENSITIVITY OF THIS METHODOLOGY IS >24 mIU/mL   I-STAT, chem 8     Status: Abnormal   Collection Time: 07/07/19  7:31 AM  Result Value Ref Range   Sodium 144 135 - 145 mmol/L   Potassium 4.6 3.5 - 5.1 mmol/L   Chloride 100 98 - 111 mmol/L   BUN 11 6 - 20 mg/dL   Creatinine, Ser 1.10 (H) 0.44 - 1.00 mg/dL   Glucose, Bld 93 70 - 99 mg/dL    Comment: Glucose reference range applies only to samples taken after fasting for at least 8 hours.   Calcium, Ion 1.29 1.15 - 1.40 mmol/L   TCO2 30 22 - 32 mmol/L   Hemoglobin 14.3 12.0 - 15.0 g/dL   HCT 42.0 36 - 46 %    Assessment/Plan: 39 y/o P2 here for a laparoscopic bilateral tubal fulguration for permanent sterilization, - Admit to [facility] and IV fluids This procedure has been fully reviewed with the patient and written informed consent has been obtained. We discussed risks, benefits and alternatives of laparoscopic fulguration including but not limited to risks of bleeding, infection, damage to organs.  She also understood the risk of tubal regret but she states she is 100% sure she did not want any more children.  We discussed small risk of failure of the procedure with an increased  risk of ectopic pregnancy in case of failure.  She understood there were other kinds of birth control such as pills, patches, IUDs, vaginal rings and depo provera which were temporary but she did not desire them.  She understood there was also an option of female sterilization but she did not desire that option either.  She did not desire complete tubal removal or  placement of tubal clips.  All her questions were answered and she was consented for the procedure.    She expressed understanding that if she does have irregular or heavy bleeding after the sterilization she may require hormone control in the form of birth control or medication to regulate her cycles.   Alinda Dooms, MD 07/06/2019, 12:53 PM

## 2019-07-07 ENCOUNTER — Ambulatory Visit (HOSPITAL_BASED_OUTPATIENT_CLINIC_OR_DEPARTMENT_OTHER): Payer: BC Managed Care – PPO | Admitting: Physician Assistant

## 2019-07-07 ENCOUNTER — Encounter (HOSPITAL_BASED_OUTPATIENT_CLINIC_OR_DEPARTMENT_OTHER)
Admission: RE | Disposition: A | Discharge status: Home / Self Care | Payer: Self-pay | Source: Home / Self Care | Attending: Obstetrics & Gynecology

## 2019-07-07 ENCOUNTER — Ambulatory Visit (HOSPITAL_COMMUNITY)
Admission: RE | Admit: 2019-07-07 | Discharge: 2019-07-07 | Disposition: A | Discharge status: Home / Self Care | Payer: BC Managed Care – PPO | Attending: Obstetrics & Gynecology | Admitting: Obstetrics & Gynecology

## 2019-07-07 ENCOUNTER — Encounter (HOSPITAL_BASED_OUTPATIENT_CLINIC_OR_DEPARTMENT_OTHER): Payer: Self-pay | Admitting: Obstetrics & Gynecology

## 2019-07-07 DIAGNOSIS — Z6837 Body mass index (BMI) 37.0-37.9, adult: Secondary | ICD-10-CM | POA: Diagnosis not present

## 2019-07-07 DIAGNOSIS — Z302 Encounter for sterilization: Secondary | ICD-10-CM | POA: Diagnosis not present

## 2019-07-07 DIAGNOSIS — E669 Obesity, unspecified: Secondary | ICD-10-CM | POA: Diagnosis not present

## 2019-07-07 DIAGNOSIS — I429 Cardiomyopathy, unspecified: Secondary | ICD-10-CM | POA: Insufficient documentation

## 2019-07-07 HISTORY — DX: Cardiomyopathy, unspecified: I42.9

## 2019-07-07 HISTORY — PX: LAPAROSCOPIC TUBAL LIGATION: SHX1937

## 2019-07-07 LAB — POCT I-STAT, CHEM 8
BUN: 11 mg/dL (ref 6–20)
Calcium, Ion: 1.29 mmol/L (ref 1.15–1.40)
Chloride: 100 mmol/L (ref 98–111)
Creatinine, Ser: 1.1 mg/dL — ABNORMAL HIGH (ref 0.44–1.00)
Glucose, Bld: 93 mg/dL (ref 70–99)
HCT: 42 % (ref 36.0–46.0)
Hemoglobin: 14.3 g/dL (ref 12.0–15.0)
Potassium: 4.6 mmol/L (ref 3.5–5.1)
Sodium: 144 mmol/L (ref 135–145)
TCO2: 30 mmol/L (ref 22–32)

## 2019-07-07 LAB — POCT PREGNANCY, URINE: Preg Test, Ur: NEGATIVE

## 2019-07-07 SURGERY — LIGATION, FALLOPIAN TUBE, LAPAROSCOPIC
Anesthesia: General | Site: Abdomen | Laterality: Bilateral

## 2019-07-07 MED ORDER — LACTATED RINGERS IV SOLN: INTRAVENOUS | Status: DC

## 2019-07-07 MED ORDER — DEXAMETHASONE SODIUM PHOSPHATE 4 MG/ML IJ SOLN
INTRAMUSCULAR | Status: DC | PRN
  Administered 2019-07-07: 10 mg via INTRAVENOUS

## 2019-07-07 MED ORDER — MIDAZOLAM HCL 5 MG/5ML IJ SOLN
INTRAMUSCULAR | Status: DC | PRN
  Administered 2019-07-07: 2 mg via INTRAVENOUS

## 2019-07-07 MED ORDER — ROCURONIUM BROMIDE 10 MG/ML (PF) SYRINGE
PREFILLED_SYRINGE | INTRAVENOUS | Status: AC
  Filled 2019-07-07: qty 10

## 2019-07-07 MED ORDER — LIDOCAINE 2% (20 MG/ML) 5 ML SYRINGE
INTRAMUSCULAR | Status: AC
  Filled 2019-07-07: qty 5

## 2019-07-07 MED ORDER — FENTANYL CITRATE (PF) 100 MCG/2ML IJ SOLN
INTRAMUSCULAR | Status: AC
  Filled 2019-07-07: qty 2

## 2019-07-07 MED ORDER — PROMETHAZINE HCL 25 MG/ML IJ SOLN: 6.2500 mg | INTRAMUSCULAR | Status: DC | PRN

## 2019-07-07 MED ORDER — KETOROLAC TROMETHAMINE 30 MG/ML IJ SOLN
INTRAMUSCULAR | Status: AC
  Filled 2019-07-07: qty 1

## 2019-07-07 MED ORDER — LIDOCAINE HCL (CARDIAC) PF 100 MG/5ML IV SOSY
PREFILLED_SYRINGE | INTRAVENOUS | Status: DC | PRN
  Administered 2019-07-07: 80 mg via INTRAVENOUS

## 2019-07-07 MED ORDER — FENTANYL CITRATE (PF) 100 MCG/2ML IJ SOLN
INTRAMUSCULAR | Status: DC | PRN
  Administered 2019-07-07 (×2): 50 ug via INTRAVENOUS
  Administered 2019-07-07: 100 ug via INTRAVENOUS

## 2019-07-07 MED ORDER — OXYCODONE HCL 5 MG PO TABS
5.0000 mg | ORAL_TABLET | Freq: Once | ORAL | Status: AC | PRN
  Administered 2019-07-07: 5 mg via ORAL

## 2019-07-07 MED ORDER — MIDAZOLAM HCL 2 MG/2ML IJ SOLN
INTRAMUSCULAR | Status: AC
  Filled 2019-07-07: qty 2

## 2019-07-07 MED ORDER — FENTANYL CITRATE (PF) 100 MCG/2ML IJ SOLN: 25.0000 ug | INTRAMUSCULAR | Status: DC | PRN

## 2019-07-07 MED ORDER — IBUPROFEN 800 MG PO TABS: 800.0000 mg | ORAL_TABLET | Freq: Three times a day (TID) | ORAL | 0 refills | Status: DC | PRN

## 2019-07-07 MED ORDER — PROPOFOL 10 MG/ML IV BOLUS
INTRAVENOUS | Status: DC | PRN
  Administered 2019-07-07: 200 mg via INTRAVENOUS

## 2019-07-07 MED ORDER — OXYCODONE HCL 5 MG PO TABS
ORAL_TABLET | ORAL | Status: AC
  Filled 2019-07-07: qty 1

## 2019-07-07 MED ORDER — ONDANSETRON HCL 4 MG/2ML IJ SOLN
INTRAMUSCULAR | Status: AC
  Filled 2019-07-07: qty 2

## 2019-07-07 MED ORDER — ROCURONIUM BROMIDE 100 MG/10ML IV SOLN
INTRAVENOUS | Status: DC | PRN
  Administered 2019-07-07: 10 mg via INTRAVENOUS
  Administered 2019-07-07: 50 mg via INTRAVENOUS

## 2019-07-07 MED ORDER — SUGAMMADEX SODIUM 200 MG/2ML IV SOLN
INTRAVENOUS | Status: DC | PRN
  Administered 2019-07-07: 200 mg via INTRAVENOUS

## 2019-07-07 MED ORDER — KETOROLAC TROMETHAMINE 30 MG/ML IJ SOLN
INTRAMUSCULAR | Status: DC | PRN
Start: 2019-07-07 — End: 2019-07-07
  Administered 2019-07-07: 30 mg via INTRAVENOUS

## 2019-07-07 MED ORDER — PROPOFOL 10 MG/ML IV BOLUS
INTRAVENOUS | Status: AC
  Filled 2019-07-07: qty 20

## 2019-07-07 MED ORDER — OXYCODONE HCL 5 MG/5ML PO SOLN: 5.0000 mg | Freq: Once | ORAL | Status: AC | PRN

## 2019-07-07 MED ORDER — LIDOCAINE-EPINEPHRINE 1 %-1:100000 IJ SOLN
INTRAMUSCULAR | Status: DC | PRN
  Administered 2019-07-07: 10 mL

## 2019-07-07 MED ORDER — ONDANSETRON HCL 4 MG/2ML IJ SOLN
INTRAMUSCULAR | Status: DC | PRN
  Administered 2019-07-07: 4 mg via INTRAVENOUS

## 2019-07-07 SURGICAL SUPPLY — 36 items
BENZOIN TINCTURE PRP APPL 2/3 (GAUZE/BANDAGES/DRESSINGS) IMPLANT
CABLE HIGH FREQUENCY MONO STRZ (ELECTRODE) ×3 IMPLANT
CANISTER SUCT 1200ML W/VALVE (MISCELLANEOUS) IMPLANT
CLOSURE WOUND 1/4X4 (GAUZE/BANDAGES/DRESSINGS)
COVER WAND RF STERILE (DRAPES) ×3 IMPLANT
DECANTER SPIKE VIAL GLASS SM (MISCELLANEOUS) ×3 IMPLANT
DERMABOND ADVANCED (GAUZE/BANDAGES/DRESSINGS) ×2
DERMABOND ADVANCED .7 DNX12 (GAUZE/BANDAGES/DRESSINGS) ×1 IMPLANT
DRSG COVADERM PLUS 2X2 (GAUZE/BANDAGES/DRESSINGS) ×3 IMPLANT
DRSG OPSITE POSTOP 3X4 (GAUZE/BANDAGES/DRESSINGS) ×3 IMPLANT
DURAPREP 26ML APPLICATOR (WOUND CARE) ×3 IMPLANT
GAUZE 4X4 16PLY RFD (DISPOSABLE) ×3 IMPLANT
GLOVE BIOGEL PI IND STRL 7.0 (GLOVE) ×2 IMPLANT
GLOVE BIOGEL PI INDICATOR 7.0 (GLOVE) ×4
GLOVE ECLIPSE 6.5 STRL STRAW (GLOVE) ×3 IMPLANT
GOWN STRL REUS W/TWL LRG LVL3 (GOWN DISPOSABLE) ×9 IMPLANT
LIGASURE VESSEL 5MM BLUNT TIP (ELECTROSURGICAL) IMPLANT
NEEDLE INSUFFLATION 120MM (ENDOMECHANICALS) ×3 IMPLANT
NS IRRIG 1000ML POUR BTL (IV SOLUTION) IMPLANT
PACK LAPAROSCOPY BASIN (CUSTOM PROCEDURE TRAY) ×3 IMPLANT
PACK TRENDGUARD 450 HYBRID PRO (MISCELLANEOUS) ×1 IMPLANT
PROTECTOR NERVE ULNAR (MISCELLANEOUS) ×6 IMPLANT
SCISSORS LAP 5X35 DISP (ENDOMECHANICALS) ×3 IMPLANT
SET SUCTION IRRIG HYDROSURG (IRRIGATION / IRRIGATOR) IMPLANT
SET TUBE SMOKE EVAC HIGH FLOW (TUBING) ×3 IMPLANT
SOLUTION ELECTROLUBE (MISCELLANEOUS) ×3 IMPLANT
STRIP CLOSURE SKIN 1/4X4 (GAUZE/BANDAGES/DRESSINGS) IMPLANT
SUT MNCRL AB 4-0 PS2 18 (SUTURE) ×3 IMPLANT
SUT VICRYL 0 UR6 27IN ABS (SUTURE) ×3 IMPLANT
TOWEL OR 17X26 10 PK STRL BLUE (TOWEL DISPOSABLE) ×3 IMPLANT
TRAY FOLEY W/BAG SLVR 14FR (SET/KITS/TRAYS/PACK) ×3 IMPLANT
TRENDGUARD 450 HYBRID PRO PACK (MISCELLANEOUS) ×3
TROCAR BALLN 12MMX100 BLUNT (TROCAR) IMPLANT
TROCAR BLADELESS OPT 5 100 (ENDOMECHANICALS) ×6 IMPLANT
TROCAR XCEL NON-BLD 11X100MML (ENDOMECHANICALS) IMPLANT
WARMER LAPAROSCOPE (MISCELLANEOUS) ×3 IMPLANT

## 2019-07-07 NOTE — Anesthesia Postprocedure Evaluation (Signed)
Anesthesia Post Note  Patient: [name]  Procedure(s) Performed: LAPAROSCOPIC TUBAL LIGATION BY FULGURATION (Bilateral Abdomen)     Patient location during evaluation: PACU Anesthesia Type: General Level of consciousness: awake and alert Pain management: pain level controlled Vital Signs Assessment: post-procedure vital signs reviewed and stable Respiratory status: spontaneous breathing, nonlabored ventilation and respiratory function stable Cardiovascular status: blood pressure returned to baseline and stable Postop Assessment: no apparent nausea or vomiting Anesthetic complications: no   No complications documented.  Last Vitals:  Vitals:   07/07/19 1205 07/07/19 1250  BP: 124/61 121/69  Pulse: 84 79  Resp: 18 16  Temp:    SpO2: 94% 99%    Last Pain:  Vitals:   07/07/19 1205  TempSrc:   PainSc: 4                  Beryle Lathe

## 2019-07-07 NOTE — Discharge Instructions (Signed)
DISCHARGE INSTRUCTIONS: Laparoscopy  The following instructions have been prepared to help you care for yourself upon your return home today.  Wound care: Marland Kitchen Do not get the incision wet for the first 24 hours. The incision should be kept clean and dry. . The Band-Aids or dressings may be removed the day after surgery. . Should the incision become sore, red, and swollen after the first week, check with your doctor.  Personal hygiene: . Shower the day after your procedure.  Activity and limitations: . Do NOT drive or operate any equipment today. . Do NOT lift anything more than 15 pounds for 2-3 weeks after surgery. . Do NOT rest in bed all day. . Walking is encouraged. Walk each day, starting slowly with 5-minute walks 3 or 4 times a day. Slowly increase the length of your walks. . Walk up and down stairs slowly. . Do NOT do strenuous activities, such as golfing, playing tennis, bowling, running, biking, weight lifting, gardening, mowing, or vacuuming for 2-4 weeks. Ask your doctor when it is okay to start.  Diet: Eat a light meal as desired this evening. You may resume your usual diet tomorrow.  Return to work: This is dependent on the type of work you do. For the most part you can return to a desk job within a week of surgery. If you are more active at work, please discuss this with your doctor.  What to expect after your surgery: You may have a slight burning sensation when you urinate on the first day. You may have a very small amount of blood in the urine. Expect to have a small amount of vaginal discharge/light bleeding for 1-2 weeks. It is not unusual to have abdominal soreness and bruising for up to 2 weeks. You may be tired and need more rest for about 1 week. You may experience shoulder pain for 24-72 hours. Lying flat in bed may relieve it.  Call your doctor for any of the following: . Develop a fever of 100.4 or greater . Inability to urinate 6 hours after discharge from  hospital . Severe pain not relieved by pain medications . Persistent of heavy bleeding at incision site . Redness or swelling around incision site after a week . Increasing nausea or vomiting  Patient Signature________________________________________ Nurse Signature_________________________________________ Post Anesthesia Home Care Instructions  Activity: Get plenty of rest for the remainder of the day. A responsible individual must stay with you for 24 hours following the procedure.  For the next 24 hours, DO NOT: -Drive a car -Paediatric nurse -Drink alcoholic beverages -Take any medication unless instructed by your physician -Make any legal decisions or sign important papers.  Meals: Start with liquid foods such as gelatin or soup. Progress to regular foods as tolerated. Avoid greasy, spicy, heavy foods. If nausea and/or vomiting occur, drink only clear liquids until the nausea and/or vomiting subsides. Call your physician if vomiting continues.  Special Instructions/Symptoms: Your throat may feel dry or sore from the anesthesia or the breathing tube placed in your throat during surgery. If this causes discomfort, gargle with warm salt water. The discomfort should disappear within 24 hours.  If you had a scopolamine patch placed behind your ear for the management of post- operative nausea and/or vomiting:  1. The medication in the patch is effective for 72 hours, after which it should be removed.  Wrap patch in a tissue and discard in the trash. Wash hands thoroughly with soap and water. 2. You may remove the patch  earlier than 72 hours if you experience unpleasant side effects which may include dry mouth, dizziness or visual disturbances. 3. Avoid touching the patch. Wash your hands with soap and water after contact with the patch.     DISCHARGE INSTRUCTIONS FROM DR. Britt Boozer, You may remove the incision dressings in one week (next week on Friday) but they may fall out by  themselves sooner than that and that's okay.  You may shower as usual, keeping water exposure over the abdomen to a minimal and patting the incisions dry after showering.  To remove the dressings in 1 week, soak them wet with water (allow water to soak them during showering) and then gently peel them away  Starting at the corners.  After showering always pat the incisions dry.     Do not have any intercourse after the procedure until cleared.  Expect some light vaginal bleeding, do not use tampons or douching for the next two weeks.   Please call me at the office if any problems, [phone].

## 2019-07-07 NOTE — Transfer of Care (Signed)
Immediate Anesthesia Transfer of Care Note  Patient: [name]  Procedure(s) Performed: Procedure(s) (LRB): LAPAROSCOPIC TUBAL LIGATION BY FULGURATION (Bilateral)  Patient Location: PACU  Anesthesia Type: General  Level of Consciousness: awake, sedated, patient cooperative and responds to stimulation  Airway & Oxygen Therapy: Patient Spontanous Breathing and Patient connected to Holiday 02 and soft FM   Post-op Assessment: Report given to PACU RN, Post -op Vital signs reviewed and stable and Patient moving all extremities  Post vital signs: Reviewed and stable  Complications: No apparent anesthesia complications

## 2019-07-07 NOTE — Interval H&P Note (Signed)
History and Physical Interval Note:  07/07/2019 9:09 AM  [name]  has presented today for surgery, with the diagnosis of Desire for Surgical Sterlization.  The various methods of treatment have been discussed with the patient and family. After consideration of risks, benefits and other options for treatment, the patient has consented to  Procedure(s): LAPAROSCOPIC TUBAL LIGATION (Bilateral) via bilateral fulguration as a surgical intervention.  The patient's history has been reviewed, patient examined, no change in status, stable for surgery.  I have reviewed the patient's chart and labs.  Questions were answered to the patient's satisfaction.    Konrad Felix, MD.

## 2019-07-07 NOTE — Op Note (Signed)
[name].  MRN [id number] DOB Feb 08, 1980     PREOP DIAGNOSIS: Parous patient desiring permanent sterilization.    POSTOP DIAGNOSIS: Same as above   PROCEDURE: Laparoscopic bilateral fulguration.    SURGEON: Dr. Hoover Browns   ASSISTANT: None.    ANESTHESIA: General ETA.   COMPLICATIONS: None   EBL: 10 mL   IV FLUID: 1500 mL LR   URINE OUTPUT: 200 mL   FINDINGS: Normal uterus. Normal right and left fallopian tubes.  Normal left and right ovaries.    PROCEDURE:    Informed consent was obtained from the patient to undergo the procedure after discussing the risks benefits and alternatives of the procedure. She was taken to the operating room where anesthesia was administered without difficulty. Both arms were tucked and she was placed in the dorsal lithotomy position. She was prepped abdominally, vaginally and perineum in the usual sterile fashion. Foley catheter was placed in the bladder and a  Acorn uterine manipulator was placed in.     Attention was then turned to the abdomen where lidocaine with epinephrine was instilled in the infraumbilical area. Open entry into abdomen was performed through a 15 mm infraumbilical incision.  The fascia was tied with 0 Vicryl at the edges.  The 11 mm Hasson trocar was placed in and gas 20 mL gas instilled into the bulb, The abdomen was insufflated with gas, abdominal pressure maintained at .  The scope was then placed in and the pelvis was visualized. Two 5 mm ports were placed using the XL trocar on the left side of abdomen under direct visualization. The left fallopian tube was then fulgurated after following it to the fimbria end with the Kleppinger about 3 cm distal to the uterine cornua, first proximal region was fulgurated, then distal portion then mid portion with blanching of the tube noted.   The fulgurated portions of tube was then transected with the endo shears and a small segment excised.  The right fallopian tube was similarly  fulgurated after following it to the fimbria end, transected and a small portion of the fulgurated part excised.   The pelvis was inspected and it was noted to be hemostatic. All instruments were removed.       Umbilical incision was closed using 0 Vicryl  Suture.  The skin was closed with 4-0 Monocryl. The left side ports incisions were also closed with 4-0 Monocryl and dermabond.  Honey comb and cover derms were applied.  Foley catheter and the Acorn manipulator were removed.  The patient was then awoken from anesthesia and she was taken to recovery room in stable condition   SPECIMEN: Small portions of fulgurated left and right fallopian tubes.     Lavaris Sexson, MD. 07/07/2019.

## 2019-07-07 NOTE — Anesthesia Preprocedure Evaluation (Addendum)
Anesthesia Evaluation  Patient identified by MRN, date of birth, ID band Patient awake    Reviewed: Allergy & Precautions, NPO status , Patient's Chart, lab work & pertinent test results  History of Anesthesia Complications Negative for: history of anesthetic complications  Airway Mallampati: II  TM Distance: >3 FB Neck ROM: Full    Dental  (+) Dental Advisory Given, Teeth Intact, Chipped,    Pulmonary asthma ,    Pulmonary exam normal        Cardiovascular Normal cardiovascular exam+ dysrhythmias (PVCs)    '21 TTE - EF 50 to 55%. The left ventricle demonstrates global hypokinesis. EF appears low normal to mildly reduced. Frequent ventricular ectopy appears to be affect LV function. Trivial pericardial effusion is present. Mild mitral valve regurgitation. There is redundancy of the interatrial septum.     Neuro/Psych negative neurological ROS  negative psych ROS   GI/Hepatic negative GI ROS, Neg liver ROS,   Endo/Other   Obesity   Renal/GU negative Renal ROS     Musculoskeletal negative musculoskeletal ROS (+)   Abdominal   Peds  Hematology negative hematology ROS (+)   Anesthesia Other Findings Covid test negative   Reproductive/Obstetrics                            Anesthesia Physical Anesthesia Plan  ASA: II  Anesthesia Plan: General   Post-op Pain Management:    Induction: Intravenous  PONV Risk Score and Plan: 4 or greater and Treatment may vary due to age or medical condition, Ondansetron, Midazolam, Dexamethasone and Scopolamine patch - Pre-op  Airway Management Planned: Oral ETT  Additional Equipment: None  Intra-op Plan:   Post-operative Plan: Extubation in OR  Informed Consent: I have reviewed the patients History and Physical, chart, labs and discussed the procedure including the risks, benefits and alternatives for the proposed anesthesia with the patient or  authorized representative who has indicated his/her understanding and acceptance.     Dental advisory given  Plan Discussed with: CRNA and Anesthesiologist  Anesthesia Plan Comments:        Anesthesia Quick Evaluation

## 2019-07-07 NOTE — Progress Notes (Signed)
Spoke with Dr.Thomas Mal Amabile to report patient's bigeminy and trigeminy rhythm and shortness of breath that is neither new nor different from her norm.  Okay to discharge home when ready.

## 2019-07-07 NOTE — Anesthesia Procedure Notes (Signed)
Procedure Name: Intubation Date/Time: 07/07/2019 9:18 AM Performed by: Justice Rocher, CRNA Pre-anesthesia Checklist: Patient identified, Emergency Drugs available, Suction available, Patient being monitored and Timeout performed Patient Re-evaluated:Patient Re-evaluated prior to induction Oxygen Delivery Method: Circle system utilized Preoxygenation: Pre-oxygenation with 100% oxygen Induction Type: IV induction Ventilation: Mask ventilation without difficulty Laryngoscope Size: Mac and 3 Grade View: Grade II Tube type: Oral Tube size: 7.5 mm Number of attempts: 1 Airway Equipment and Method: Stylet and Oral airway Placement Confirmation: ETT inserted through vocal cords under direct vision,  positive ETCO2,  breath sounds checked- equal and bilateral and CO2 detector Secured at: 22 cm Tube secured with: Tape Dental Injury: Teeth and Oropharynx as per pre-operative assessment

## 2019-07-10 ENCOUNTER — Encounter (HOSPITAL_BASED_OUTPATIENT_CLINIC_OR_DEPARTMENT_OTHER): Payer: Self-pay | Admitting: Obstetrics & Gynecology

## 2019-07-10 LAB — SURGICAL PATHOLOGY

## 2019-09-01 ENCOUNTER — Other Ambulatory Visit: Payer: Self-pay | Admitting: Sleep Medicine

## 2019-09-01 ENCOUNTER — Other Ambulatory Visit: Payer: BC Managed Care – PPO

## 2019-09-01 DIAGNOSIS — Z20822 Contact with and (suspected) exposure to covid-19: Secondary | ICD-10-CM

## 2019-09-02 LAB — SPECIMEN STATUS REPORT

## 2019-09-02 LAB — SARS-COV-2, NAA 2 DAY TAT

## 2019-09-02 LAB — NOVEL CORONAVIRUS, NAA: SARS-CoV-2, NAA: NOT DETECTED

## 2019-09-20 ENCOUNTER — Ambulatory Visit: Payer: BC Managed Care – PPO | Admitting: Cardiology

## 2019-09-20 ENCOUNTER — Other Ambulatory Visit: Payer: Self-pay

## 2019-09-20 NOTE — Progress Notes (Signed)
Rescheduled

## 2019-09-22 ENCOUNTER — Other Ambulatory Visit: Payer: Self-pay

## 2019-09-22 ENCOUNTER — Other Ambulatory Visit: Payer: BC Managed Care – PPO

## 2019-09-22 DIAGNOSIS — Z20822 Contact with and (suspected) exposure to covid-19: Secondary | ICD-10-CM

## 2019-09-25 ENCOUNTER — Emergency Department (HOSPITAL_COMMUNITY)
Admission: EM | Admit: 2019-09-25 | Discharge: 2019-09-26 | Disposition: A | Discharge status: Home / Self Care | Payer: BC Managed Care – PPO | Attending: Emergency Medicine | Admitting: Emergency Medicine

## 2019-09-25 ENCOUNTER — Emergency Department (HOSPITAL_COMMUNITY): Payer: BC Managed Care – PPO

## 2019-09-25 DIAGNOSIS — R072 Precordial pain: Secondary | ICD-10-CM

## 2019-09-25 DIAGNOSIS — R9431 Abnormal electrocardiogram [ECG] [EKG]: Secondary | ICD-10-CM | POA: Diagnosis not present

## 2019-09-25 DIAGNOSIS — J45909 Unspecified asthma, uncomplicated: Secondary | ICD-10-CM | POA: Insufficient documentation

## 2019-09-25 DIAGNOSIS — R091 Pleurisy: Secondary | ICD-10-CM | POA: Insufficient documentation

## 2019-09-25 DIAGNOSIS — Z79899 Other long term (current) drug therapy: Secondary | ICD-10-CM | POA: Insufficient documentation

## 2019-09-25 LAB — BASIC METABOLIC PANEL
Anion gap: 9 (ref 5–15)
BUN: 7 mg/dL (ref 6–20)
CO2: 25 mmol/L (ref 22–32)
Calcium: 9.4 mg/dL (ref 8.9–10.3)
Chloride: 106 mmol/L (ref 98–111)
Creatinine, Ser: 0.94 mg/dL (ref 0.44–1.00)
GFR calc Af Amer: >60 mL/min (ref >60)
GFR calc non Af Amer: >60 mL/min (ref >60)
Glucose, Bld: 101 mg/dL — ABNORMAL HIGH (ref 70–99)
Potassium: 4.4 mmol/L (ref 3.5–5.1)
Sodium: 140 mmol/L (ref 135–145)

## 2019-09-25 LAB — CBC
HCT: 38.9 % (ref 36.0–46.0)
Hemoglobin: 12.1 g/dL (ref 12.0–15.0)
MCH: 30.7 pg (ref 26.0–34.0)
MCHC: 31.1 g/dL (ref 30.0–36.0)
MCV: 98.7 fL (ref 80.0–100.0)
Platelets: 319 10*3/uL (ref 150–400)
RBC: 3.94 MIL/uL (ref 3.87–5.11)
RDW: 11.8 % (ref 11.5–15.5)
WBC: 7.5 10*3/uL (ref 4.0–10.5)
nRBC: 0 % (ref 0.0–0.2)

## 2019-09-25 LAB — NOVEL CORONAVIRUS, NAA: SARS-CoV-2, NAA: NOT DETECTED

## 2019-09-25 LAB — I-STAT BETA HCG BLOOD, ED (MC, WL, AP ONLY): I-stat hCG, quantitative: <5 m[IU]/mL (ref <5)

## 2019-09-25 LAB — TROPONIN I (HIGH SENSITIVITY): Troponin I (High Sensitivity): <2 ng/L (ref <18)

## 2019-09-25 MED ORDER — IOHEXOL 350 MG/ML SOLN
75.0000 mL | Freq: Once | INTRAVENOUS | Status: AC | PRN
  Administered 2019-09-25: 75 mL via INTRAVENOUS

## 2019-09-25 NOTE — ED Provider Notes (Signed)
Called by triage RN requesting CT scan order placed.  Per triage RN, patient is having right-sided chest pain.  She was diagnosed with Covid over 2 weeks ago.  She had a D-dimer drawn recently at an outside urgent care, was found to be elevated.  She has a CT scheduled for this week, but she had worsening pain tonight, thus was told to come to the ER.  She continues to have right-sided chest pain, vital signs stable.  Will order CTA to rule out PE.   Alveria Apley, PA-C 09/25/19 2129    Maia Plan, MD 09/25/19 2241

## 2019-09-25 NOTE — ED Triage Notes (Addendum)
Pt c/o R sided CP through to back with shob, pt seen at UC last week was told her Ddimer was high, she is due to have chest CT this week but stated feeling worse. COVID 09/08/19

## 2019-09-26 LAB — MAGNESIUM: Magnesium: 1.7 mg/dL (ref 1.7–2.4)

## 2019-09-26 MED ORDER — IBUPROFEN 800 MG PO TABS
800.0000 mg | ORAL_TABLET | Freq: Three times a day (TID) | ORAL | 0 refills | Status: DC
Start: 2019-09-26 — End: 2020-03-27

## 2019-09-26 NOTE — ED Provider Notes (Signed)
[facility] EMERGENCY DEPARTMENT Provider Note   CSN: [id number] Arrival date & time: 09/25/19  2039     History Chief Complaint  Patient presents with  . Chest Pain    [name] is a 39 y.o. female.  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  She states that she has been having pain since 9/7.  She states the pain is on the right side of her chest and radiates into her back.  It is worsened when she takes a deep breath.  She states that she does feel short of breath with exertion, but does not have any shortness of breath at rest.  She denies history of PE or DVT.  She tested positive for Covid on 8/27.  She states that she has recovered well from this.  She reports that she was seen at an urgent care for the same recently and had an elevated D-dimer.  She was instructed to come to the emergency department for CT scan if her symptoms did not improve.   Chest Pain      Past Medical History:  Diagnosis Date  . Asthma   . 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 Chlamydia     Patient Active Problem List   Diagnosis Date Noted  . 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 Cardiomyopathy (HCC) 04/04/2018  . NSVT (nonsustained ventricular tachycardia) (HCC) 04/04/2018  . PVC's (premature ventricular contractions) 03/25/2018  . Cardiac arrhythmia 03/25/2018  . Asthma 11/18/2010    Past Surgical History:  Procedure Laterality Date  . Cyst removal rt ear  yrs ago  . LAPAROSCOPIC TUBAL LIGATION Bilateral 07/07/2019   Procedure: LAPAROSCOPIC TUBAL LIGATION BY FULGURATION;  Surgeon: Hoover Browns, MD;  Location: [facility];  Service: Gynecology;  Laterality: Bilateral;  . PVC ABLATION N/A 06/30/2018   Procedure:  PVC ABLATION;  Surgeon: Regan Lemming, MD;  Location: [facility];  Service: Cardiovascular;  Laterality: N/A;     OB History    Gravida  2   Para  2   Term  2   Preterm      AB      Living  2     SAB      TAB      Ectopic      Multiple      Live Births  2           Family History  Problem Relation Age of Onset  . Hypertension Mother   . Cancer Mother        leukemia  . Hypertension Brother   . Hypertension Maternal Grandmother   . Heart attack Maternal Grandmother 60       CABG  . Cancer Paternal Grandmother        breast  . Diabetes Paternal Grandmother   . Heart attack Father 53    Social History   Tobacco Use  . Smoking status: Never Smoker  . Smokeless tobacco: Never Used  Vaping Use  . Vaping Use: Never used  Substance Use Topics  . Alcohol use: Yes    Comment: occ  . Drug use: No    Home Medications Prior to Admission medications   Medication Sig Start Date End Date Taking? Authorizing  Provider  albuterol (PROVENTIL HFA) 108 (90 BASE) MCG/ACT inhaler Inhale 2 puffs into the lungs every 6 (six) hours as needed for wheezing. For shortness of breath or wheezing 10/27/11   Henreitta Leber, PA-C  ibuprofen (ADVIL) 800 MG tablet Take 1 tablet (800 mg total) by mouth every 8 (eight) hours as needed for moderate pain or cramping. 07/07/19   Hoover Browns, MD  mexiletine (MEXITIL) 150 MG capsule TAKE 1 CAPSULE BY MOUTH TWICE A DAY 08/30/18   Camnitz, Andree Coss, MD    Allergies    Patient has no known allergies.  Review of Systems   Review of Systems  Cardiovascular: Positive for chest pain.  All other systems reviewed and are negative.   Physical Exam Updated Vital Signs BP 121/78 (BP Location: Left Arm)   Pulse 73   Temp 98.6 F (37 C) (Oral)   Resp 20   Ht 5\' 6"  (1.676 m)   Wt 104 kg   SpO2 100%   BMI 37.01 kg/m   Physical Exam Vitals and nursing note reviewed.  Constitutional:      General: She is not in acute  distress.    Appearance: She is well-developed.  HENT:     Head: Normocephalic and atraumatic.  Eyes:     Conjunctiva/sclera: Conjunctivae normal.  Cardiovascular:     Rate and Rhythm: Normal rate and regular rhythm.     Heart sounds: No murmur heard.      Comments: PVCs Pulmonary:     Effort: Pulmonary effort is normal. No respiratory distress.     Breath sounds: Normal breath sounds.  Abdominal:     Palpations: Abdomen is soft.     Tenderness: There is no abdominal tenderness.  Musculoskeletal:        General: Normal range of motion.     Cervical back: Neck supple.  Skin:    General: Skin is warm and dry.  Neurological:     Mental Status: She is alert and oriented to person, place, and time.  Psychiatric:        Mood and Affect: Mood normal.        Behavior: Behavior normal.     ED Results / Procedures / Treatments   Labs (all labs ordered are listed, but only abnormal results are displayed) Labs Reviewed  BASIC METABOLIC PANEL - Abnormal; Notable for the following components:      Result Value   Glucose, Bld 101 (*)    All other components within normal limits  CBC  MAGNESIUM  I-STAT BETA HCG BLOOD, ED ([facility], [facility], [facility] ONLY)  TROPONIN I (HIGH SENSITIVITY)  TROPONIN I (HIGH SENSITIVITY)    EKG EKG Interpretation  Date/Time:  Monday September 25 2019 21:11:24 EDT Ventricular Rate:  82 PR Interval:  150 QRS Duration: 84 QT Interval:  516 QTC Calculation: 602 R Axis:   111 Text Interpretation: ** Critical Test Result: Long QTc Sinus rhythm with frequent Premature ventricular complexes Biatrial enlargement Right axis deviation Anterior infarct , age undetermined Prolonged QT Abnormal ECG When compared with ECG of 06/07/2014, QT has lengthened Confirmed by 06/09/2014 (Dione Booze) on 09/25/2019 11:50:36 PM   Radiology CT Angio Chest PE W and/or Wo Contrast  Result Date: 09/26/2019 CLINICAL DATA:  History of COVID chest pain radiating to the back EXAM: CT ANGIOGRAPHY  CHEST WITH CONTRAST TECHNIQUE: Multidetector CT imaging of the chest was performed using the standard protocol during bolus administration of intravenous contrast. Multiplanar CT image reconstructions and MIPs were obtained to  evaluate the vascular anatomy. CONTRAST:  85mL OMNIPAQUE IOHEXOL 350 MG/ML SOLN COMPARISON:  Chest x-ray 06/07/2014 FINDINGS: Cardiovascular: Satisfactory opacification of the pulmonary arteries to the segmental level. No evidence of pulmonary embolism. The heart appears slightly enlarged. No pericardial effusion. Nonaneurysmal aorta. Mediastinum/Nodes: Midline trachea. No suspicious adenopathy. Soft tissue density in the anterior mediastinum likely reflects thymic tissue. Esophagus unremarkable Lungs/Pleura: Lungs are clear. No pleural effusion or pneumothorax. Upper Abdomen: No acute abnormality. Musculoskeletal: No chest wall abnormality. No acute or significant osseous findings. Review of the MIP images confirms the above findings. IMPRESSION: Negative for acute pulmonary embolus or aortic dissection. Clear lung fields. Cardiomegaly. Electronically Signed   By: Jasmine Pang M.D.   On: 09/26/2019 00:01    Procedures Procedures (including critical care time)  Medications Ordered in ED Medications  iohexol (OMNIPAQUE) 350 MG/ML injection 75 mL (75 mLs Intravenous Contrast Given 09/25/19 2339)    ED Course  I have reviewed the triage vital signs and the nursing notes.  Pertinent labs & imaging results that were available during my care of the patient were reviewed by me and considered in my medical decision making (see chart for details).    MDM Rules/Calculators/A&P                          This patient complains of right sided chest pain, this involves an extensive number of treatment options, and is a complaint that carries with it a high risk of complications and morbidity.    Had elevated d-dimer at outside facility.  Differential Dx PE, pneumonia, COVID,  pleurisy, less likely ACS, dissection, or esophageal rupture.  Pertinent Labs I reviewed, and interpreted labs, which included CBC, BMP, trop, HCG which are all normal.  Mag is 1.7.  Imaging Interpretation I reviewed imaging studies that were ordered in triage, which show, no dissection or PE, clear lungs, notable for cardiomegaly.   Reassessments After the interventions stated above, I reevaluated the patient and found stable and comfortable with discharge plan.  Consultants Patient seen by and discussed with Dr. Preston Fleeting.  Plan Discharge with continue outpatient workup/follow up.    Final Clinical Impression(s) / ED Diagnoses Final diagnoses:  Precordial pain  Prolonged Q-T interval on ECG  Pleurisy  Rx / DC Orders ED Discharge Orders         Ordered    ibuprofen (ADVIL) 800 MG tablet  3 times daily        09/26/19 0132           Roxy Horseman, PA-C 09/26/19 0133    Dione Booze, MD 09/26/19 Darliss Ridgel    Dione Booze, MD 09/26/19 [id number]

## 2019-12-25 ENCOUNTER — Other Ambulatory Visit: Payer: Self-pay | Admitting: Cardiology

## 2020-01-18 ENCOUNTER — Ambulatory Visit: Payer: BC Managed Care – PPO | Admitting: Cardiology

## 2020-01-18 ENCOUNTER — Other Ambulatory Visit: Payer: Self-pay

## 2020-01-18 ENCOUNTER — Encounter: Payer: Self-pay | Admitting: Cardiology

## 2020-01-18 VITALS — BP 134/72 | HR 66 | Resp 16 | Ht 66.0 in | Wt 230.0 lb

## 2020-01-18 DIAGNOSIS — R072 Precordial pain: Secondary | ICD-10-CM

## 2020-01-18 DIAGNOSIS — I493 Ventricular premature depolarization: Secondary | ICD-10-CM

## 2020-01-18 NOTE — Progress Notes (Signed)
Patient referred by Estevan Ryder, PA-C for PVC's.  Subjective:   [place], female    DOB: 1980-03-14, 40 y.o.   MRN: [id number]    Chief Complaint  Patient presents with  . Chest Pain  . Follow-up    HPI  39 y/o [place] American female with symptomatic PVC's, unsuccessful ablation attempt 06/2018, PVC induced cardiomyopathy, here for chest pain.  Patient was last seen by me in 05/2018.  She was last seen by EP in 01/2019.  She is currently on mexiletine 150 mg twice daily.   She had Covid infection in August 2021, in spite of being double vaccinated.  Following recovery, she had developed chest pain.  D-dimer was elevated at 2.5.  Therefore, she was asked to undergo CTA at [facility], ED.  CT did not show any pulmonary embolism.  Her chest pain has continued.  Episodes occur more towards the end of the day, worse with exertion as well as deep breathing, relieved with rest.  Pain does not occur at rest.     Current Outpatient Medications on File Prior to Visit  Medication Sig Dispense Refill  . albuterol (PROVENTIL HFA) 108 (90 BASE) MCG/ACT inhaler Inhale 2 puffs into the lungs every 6 (six) hours as needed for wheezing. For shortness of breath or wheezing 1 Inhaler 2  . ibuprofen (ADVIL) 800 MG tablet Take 1 tablet (800 mg total) by mouth 3 (three) times daily. 21 tablet 0  . mexiletine (MEXITIL) 150 MG capsule Take 1 capsule (150 mg total) by mouth 2 (two) times daily. Please keep upcoming appt in January 2022 with Dr. Curt Bears for future refills. Thank you 180 capsule 0  . Multiple Vitamins-Minerals (MULTIVITAMIN WITH MINERALS) tablet Take 1 tablet by mouth daily.     No current facility-administered medications on file prior to visit.    Cardiovascular studies:  EKG 01/18/2020; Sinus rhythm with ventricular bigeminy Biatrial enlargement Left axis deviation Poor R wave progression Nonspecific T wave abnormality  CTA 09/25/2019: Negative for acute pulmonary embolus  or aortic dissection. Clear lung fields. Cardiomegaly.  Echocardiogram 01/18/2019: LVEF 50-55% Frequent ectopy seen Redundant interatrial septum Mild MR, TR Trivial pericardial effusion is present.   EKG 06/02/2018: Sinus rhythm with ventricular bigeminy.  Left atrial enlargement.  Inferolateral T wave inversions No significant change compared to previous EKG on 03/25/2018  Echocardiogram 03/30/2018: Left ventricle cavity is normal in size. Mild decrease in global wall motion. Visual EF is 40-45%. Wall motion and diastolic function assessment limited due to frequent PVC's. Calculated EF 42%. Right atrial cavity is mildly dilated. Moderate (Grade II) mitral regurgitation. Mild tricuspid regurgitation. Mild pulmonary hypertension. Estimated pulmonary artery systolic pressure 35 mmHg. Insignificant pericardial effusion.  Holter monitor 03/25/2018: Min HR 64 bpm, max HR 120 bpm. Average HR 79 bpm. 33.7% ventricular ecopy in the form of single PVC's, couplets, triplet, bigemiy, Longest NSVT was 6 beats. All ventricular ecopy was monomorphic. 0.1% supraventricular ectopy. No Afib, atiral flutter, high grade AV block.  Recent labs: 09/25/2019: Glucose 101, BUN/Cr 7/0.94. EGFR >60. Na/K 140/4.4.  H/H 12/38. MCV 98. Platelets 319 HbA1C N/A Trop HS <2 HS CRP 7 (<1) BNP 51 (<100) D-dimer 2.25 (<0.5) SARS COV2 positive (09/08/2019)    Review of Systems  Cardiovascular: Negative for chest pain, dyspnea on exertion, leg swelling, palpitations and syncope.         Vitals:   01/18/20 1420  BP: 134/72  Pulse: 66  Resp: 16  SpO2: 97%  Objective:   Physical Exam Vitals and nursing note reviewed.  Constitutional:      General: She is not in acute distress. Neck:     Vascular: No JVD.  Cardiovascular:     Rate and Rhythm: Normal rate and regular rhythm. Frequent extrasystoles are present.    Heart sounds: Normal heart sounds. No murmur heard.   Pulmonary:     Effort:  Pulmonary effort is normal.     Breath sounds: Normal breath sounds. No wheezing or rales.           Assessment & Recommendations:   40 y/o [place] American female with symptomatic PVC's, unsuccessful ablation attempt 06/2018, PVC induced cardiomyopathy, here for chest pain.  Chest pain: No significant risk factors for CAD.  Atypical chest pain. Unable to obtain CTA due to her ventricular trigeminy. Recommend exercise treadmill stress test after Covid testing.  Symptomatic PVCs: Remains ventricular trigeminy. Continue mexiletine 150 mg twice daily.   Continue follow-up with EP.    Further recommendations after above testing.  Nigel Mormon, MD [facility] Cardiovascular. [place] Pager: [phone] Office: [phone] If no answer Cell [phone]

## 2020-01-18 NOTE — Patient Instructions (Signed)
Diet & Lifestyle recommendations:  Physical activity recommendation (The Physical Activity Guidelines for Americans. JAMA 2018;Nov 12) At least 150-300 minutes a week of moderate-intensity, or 75-150 minutes a week of vigorous-intensity aerobic physical activity, or an equivalent combination of moderate- and vigorous-intensity aerobic activity. Adults should perform muscle-strengthening activities on 2 or more days a week. Older adults should do multicomponent physical activity that includes balance training as well as aerobic and muscle-strengthening activities. Benefits of increased physical activity include lower risk of mortality including cardiovascular mortality, lower risk of cardiovascular events and associated risk factors (hypertension and diabetes), and lower risk of many cancers (including bladder, breast, colon, endometrium, esophagus, kidney, lung, and stomach). Additional improvments have been seen in cognition, risk of dementia, anxiety and depression, improved bone health, lower risk of [place], and associated injuries.  Dietary recommendation The 2019 ACC/AHA guidelines promote nutrition as a main fixture of cardiovascular wellness, with a recommendation for a varied diet of fruit, vegetables, fish, legumes, and whole grains (Class I), as well as recommendations to reduce sodium, cholesterol, processed meats, and refined sugars (Class IIa recommendation).10 Sodium intake, a topic of some controversy as of late, is recommended to be kept at 1,500 mg/day or less, far below the average daily intake in the [place] of 3,409 mg/day, and notably below that of previous US recommendations for <2,300mg/day.10,11 For those unable to reach 1,500 mg/day, they recommend at least a reduction of 1000 mg/day.  A Pesco-Mediterranean Diet With Intermittent Fasting: JACC Review Topic of the Week. J Am Coll Cardiol 2020;76:1484-1493 Pesco-Mediterranean diet, it is supplemented with extra-virgin olive oil (EVOO),  which is the principle fat source, along with moderate amounts of dairy (particularly yogurt and cheese) and eggs, as well as modest amounts of alcohol consumption (ideally red wine with the evening meal), but few red and processed meats.  

## 2020-01-27 ENCOUNTER — Other Ambulatory Visit (HOSPITAL_COMMUNITY): Payer: Self-pay

## 2020-02-01 ENCOUNTER — Other Ambulatory Visit: Payer: Self-pay

## 2020-02-01 ENCOUNTER — Ambulatory Visit: Payer: BC Managed Care – PPO

## 2020-02-01 DIAGNOSIS — R072 Precordial pain: Secondary | ICD-10-CM

## 2020-02-02 NOTE — Progress Notes (Signed)
FYI. Seeing you next week.  [name]

## 2020-02-05 ENCOUNTER — Other Ambulatory Visit (HOSPITAL_COMMUNITY): Payer: Self-pay

## 2020-02-06 ENCOUNTER — Ambulatory Visit (INDEPENDENT_AMBULATORY_CARE_PROVIDER_SITE_OTHER): Payer: BC Managed Care – PPO | Admitting: Cardiology

## 2020-02-06 ENCOUNTER — Encounter: Payer: Self-pay | Admitting: Cardiology

## 2020-02-06 ENCOUNTER — Other Ambulatory Visit: Payer: Self-pay

## 2020-02-06 VITALS — BP 110/64 | HR 88 | Ht 66.0 in | Wt 228.2 lb

## 2020-02-06 DIAGNOSIS — I493 Ventricular premature depolarization: Secondary | ICD-10-CM

## 2020-02-06 NOTE — Progress Notes (Signed)
Electrophysiology Office Note   Date:  02/06/2020   ID:  [name], DOB Mar 14, 1980, MRN [id number]  PCP:  Gordy Clement, PA-C  Cardiologist:  Rosemary Holms Primary Electrophysiologist:  Krysti Hickling [name], MD    Chief Complaint: PVC   History of Present Illness: [name] is a 40 y.o. female who is being seen today for the evaluation of PVC at the request of [name], Side M, PA-C. Presenting today for electrophysiology evaluation.  She has a history significant for PVCs with a PVC induced cardiomyopathy.  She had an attempted ablation, but this was unsuccessful.  She is currently on mexiletine.  She had a Covid infection August 2021 despite being vaccinated.  Following recovery, she developed chest pain.  Her D-dimer was elevated.  She had a CTA which did not show any evidence of pulmonary embolism.  Chest pain is worse with deep breathing, relieved by rest.  It occurs more at the end of the day.  Today, she denies symptoms of palpitations, chest pain, shortness of breath, orthopnea, PND, lower extremity edema, claudication, dizziness, presyncope, syncope, bleeding, or neurologic sequela. The patient is tolerating medications without difficulties.  She feels well today.  She has no chest pain or shortness of breath and is able to do all of her daily activities.  The chest pain that she was previously having has improved significantly this month.  She was initially planned for stress testing, but this was rescheduled due to the snowstorm.   Past Medical History:  Diagnosis Date  . Asthma   . 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 Chlamydia    Past Surgical History:  Procedure Laterality Date  . Cyst removal rt ear  yrs ago  . LAPAROSCOPIC TUBAL LIGATION Bilateral 07/07/2019   Procedure: LAPAROSCOPIC TUBAL LIGATION BY FULGURATION;  Surgeon: Hoover Browns, MD;  Location: [facility];  Service:  Gynecology;  Laterality: Bilateral;  . PVC ABLATION N/A 06/30/2018   Procedure: PVC ABLATION;  Surgeon: Regan Lemming, MD;  [facility];  Service: Cardiovascular;  Laterality: N/A;     Current Outpatient Medications  Medication Sig Dispense Refill  . albuterol (PROVENTIL HFA) 108 (90 BASE) MCG/ACT inhaler Inhale 2 puffs into the lungs every 6 (six) hours as needed for wheezing. For shortness of breath or wheezing 1 Inhaler 2  . ibuprofen (ADVIL) 800 MG tablet Take 1 tablet (800 mg total) by mouth 3 (three) times daily. 21 tablet 0  . mexiletine (MEXITIL) 150 MG capsule Take 1 capsule (150 mg total) by mouth 2 (two) times daily. Please keep upcoming appt in January 2022 with Dr. Elberta Fortis for future refills. Thank you 180 capsule 0  . Multiple Vitamins-Minerals (MULTIVITAMIN WITH MINERALS) tablet Take 1 tablet by mouth daily.     No current facility-administered medications for this visit.    Allergies:   Patient has no known allergies.   Social History:  The patient  reports that she has never smoked. She has never used smokeless tobacco. She reports current alcohol use. She reports that she does not use drugs.   Family History:  The patient's family history includes Cancer in her mother and paternal grandmother; Diabetes in her paternal grandmother; Heart attack (age of onset: 70) in her father; Heart attack (age of onset: 12) in her maternal grandmother; Hypertension in her brother, maternal grandmother, and mother.    ROS:  Please  see the history of present illness.   Otherwise, review of systems is positive for none.   All other systems are reviewed and negative.    PHYSICAL EXAM: VS:  BP 110/64   Pulse 88   Ht 5\' 6"  (1.676 m)   Wt 228 lb 3.2 oz (103.5 kg)   SpO2 97%   BMI 36.83 kg/m  , BMI Body mass index is 36.83 kg/m. GEN: Well nourished, well developed, in no acute distress  HEENT: normal  Neck: no JVD, carotid bruits, or masses Cardiac: Irregular;  no murmurs, rubs, or gallops,no edema  Respiratory:  clear to auscultation bilaterally, normal work of breathing GI: soft, nontender, nondistended, + BS MS: no deformity or atrophy  Skin: warm and dry Neuro:  Strength and sensation are intact Psych: euthymic mood, full affect  EKG:  EKG is ordered today. Personal review of the ekg ordered shows sinus rhythm, PVCs  Recent Labs: 09/25/2019: BUN 7; Creatinine, Ser 0.94; Hemoglobin 12.1; Platelets 319; Potassium 4.4; Sodium 140 09/26/2019: Magnesium 1.7    Lipid Panel  No results found for: CHOL, TRIG, HDL, CHOLHDL, VLDL, LDLCALC, LDLDIRECT   Wt Readings from Last 3 Encounters:  02/06/20 228 lb 3.2 oz (103.5 kg)  01/18/20 230 lb (104.3 kg)  09/25/19 229 lb 4.5 oz (104 kg)      Other studies Reviewed: Additional studies/ records that were reviewed today include: TTE 02/01/20  Review of the above records today demonstrates:  Normal LV systolic function with visual EF 60-65%. Left ventricle cavity  is normal in size. Normal global wall motion. Normal diastolic filling  pattern, normal LAP.  Mild (Grade I) mitral regurgitation.  Mild tricuspid regurgitation.  Insignificant pericardial effusion.  Normal sinus rhythm with frequent premature ventricular contractions.  Compared to prior study dated 01/18/2019: LVEF improved from 50-55% to  60-65%. No other significant changes noted.    ASSESSMENT AND PLAN:  1.  PVCs: Currently on mexiletine, high risk medication monitoring.  She is continuing to have PVCs in a trigeminal pattern.  She currently feels well and fortunately her ejection fraction is improved to normal.  We [name] discontinue her mexiletine at the current dose.  She does wish to try being off of this medication.  We [name] make that change potentially at her next visit.  2.  Chest pain: Currently being worked up by primary cardiology.  Is planning on stress testing.  She has felt much better over the last few weeks and is  questioning whether or not she needs stress testing performed.  We [name] send a message to her primary cardiologist.    Current medicines are reviewed at length with the patient today.   The patient does not have concerns regarding her medicines.  The following changes were made today:  none  Labs/ tests ordered today include:  Orders Placed This Encounter  Procedures  . EKG 12-Lead     Disposition:   FU with Arvis Miguez 6 months  Signed, Hakeem Frazzini 03/18/2019, MD  02/06/2020 10:50 AM     [facility] [address] [place] [phone] (office) [phone] (fax)

## 2020-02-06 NOTE — Patient Instructions (Addendum)
Medication Instructions:  °Your physician recommends that you continue on your current medications as directed. Please refer to the Current Medication list given to you today. ° °*If you need a refill on your cardiac medications before your next appointment, please call your pharmacy* ° ° °Lab Work: °None ordered ° ° °Testing/Procedures: °None ordered ° ° °Follow-Up: °At [facility], you and your health needs are our priority.  As part of our continuing mission to provide you with exceptional heart care, we have created designated Provider Care Teams.  These Care Teams include your primary Cardiologist (physician) and Advanced Practice Providers (APPs -  Physician Assistants and Nurse Practitioners) who all work together to provide you with the care you need, when you need it. ° °Your next appointment:   °6 month(s) ° °The format for your next appointment:   °In Person ° °Provider:   °Will Camnitz, MD ° ° ° °Thank you for choosing [facility]!! ° ° °Tyara Dassow, RN °[phone] °  °

## 2020-03-20 ENCOUNTER — Other Ambulatory Visit: Payer: Self-pay | Admitting: Cardiology

## 2020-03-26 ENCOUNTER — Observation Stay (HOSPITAL_COMMUNITY)
Admission: EM | Admit: 2020-03-26 | Discharge: 2020-03-27 | Disposition: A | Discharge status: Home / Self Care | Payer: BC Managed Care – PPO | Attending: Obstetrics and Gynecology | Admitting: Obstetrics and Gynecology

## 2020-03-26 ENCOUNTER — Other Ambulatory Visit: Payer: Self-pay

## 2020-03-26 ENCOUNTER — Encounter (HOSPITAL_COMMUNITY): Payer: Self-pay

## 2020-03-26 ENCOUNTER — Emergency Department (HOSPITAL_COMMUNITY): Payer: BC Managed Care – PPO

## 2020-03-26 DIAGNOSIS — N83291 Other ovarian cyst, right side: Secondary | ICD-10-CM | POA: Insufficient documentation

## 2020-03-26 DIAGNOSIS — J45909 Unspecified asthma, uncomplicated: Secondary | ICD-10-CM | POA: Insufficient documentation

## 2020-03-26 DIAGNOSIS — Z79899 Other long term (current) drug therapy: Secondary | ICD-10-CM | POA: Diagnosis not present

## 2020-03-26 DIAGNOSIS — N83292 Other ovarian cyst, left side: Secondary | ICD-10-CM | POA: Insufficient documentation

## 2020-03-26 DIAGNOSIS — Z20822 Contact with and (suspected) exposure to covid-19: Secondary | ICD-10-CM | POA: Insufficient documentation

## 2020-03-26 DIAGNOSIS — O009 Unspecified ectopic pregnancy without intrauterine pregnancy: Secondary | ICD-10-CM

## 2020-03-26 DIAGNOSIS — R109 Unspecified abdominal pain: Secondary | ICD-10-CM | POA: Diagnosis present

## 2020-03-26 DIAGNOSIS — O00101 Right tubal pregnancy without intrauterine pregnancy: Secondary | ICD-10-CM | POA: Diagnosis not present

## 2020-03-26 DIAGNOSIS — O26899 Other specified pregnancy related conditions, unspecified trimester: Secondary | ICD-10-CM | POA: Diagnosis present

## 2020-03-26 LAB — CBC
HCT: 42.4 % (ref 36.0–46.0)
Hemoglobin: 13.6 g/dL (ref 12.0–15.0)
MCH: 31.7 pg (ref 26.0–34.0)
MCHC: 32.1 g/dL (ref 30.0–36.0)
MCV: 98.8 fL (ref 80.0–100.0)
Platelets: 306 10*3/uL (ref 150–400)
RBC: 4.29 MIL/uL (ref 3.87–5.11)
RDW: 12.6 % (ref 11.5–15.5)
WBC: 9.1 10*3/uL (ref 4.0–10.5)
nRBC: 0 % (ref 0.0–0.2)

## 2020-03-26 LAB — BASIC METABOLIC PANEL
Anion gap: 9 (ref 5–15)
BUN: 9 mg/dL (ref 6–20)
CO2: 23 mmol/L (ref 22–32)
Calcium: 9.4 mg/dL (ref 8.9–10.3)
Chloride: 103 mmol/L (ref 98–111)
Creatinine, Ser: 0.89 mg/dL (ref 0.44–1.00)
GFR, Estimated: >60 mL/min (ref >60)
Glucose, Bld: 85 mg/dL (ref 70–99)
Potassium: 4.4 mmol/L (ref 3.5–5.1)
Sodium: 135 mmol/L (ref 135–145)

## 2020-03-26 LAB — RESP PANEL BY RT-PCR (FLU A&B, COVID) ARPGX2
Influenza A by PCR: NEGATIVE
Influenza B by PCR: NEGATIVE
SARS Coronavirus 2 by RT PCR: NEGATIVE

## 2020-03-26 LAB — HCG, QUANTITATIVE, PREGNANCY: hCG, Beta Chain, Quant, S: 11743 m[IU]/mL — ABNORMAL HIGH (ref <5)

## 2020-03-26 LAB — SAMPLE TO BLOOD BANK

## 2020-03-26 MED ORDER — DEXTROSE IN LACTATED RINGERS 5 % IV SOLN: INTRAVENOUS | Status: DC

## 2020-03-26 MED ORDER — SIMETHICONE 80 MG PO CHEW: 80.0000 mg | CHEWABLE_TABLET | Freq: Four times a day (QID) | ORAL | Status: DC | PRN

## 2020-03-26 MED ORDER — ONDANSETRON HCL 4 MG PO TABS: 4.0000 mg | ORAL_TABLET | Freq: Four times a day (QID) | ORAL | Status: DC | PRN

## 2020-03-26 MED ORDER — ZOLPIDEM TARTRATE 5 MG PO TABS: 5.0000 mg | ORAL_TABLET | Freq: Every evening | ORAL | Status: DC | PRN

## 2020-03-26 MED ORDER — ONDANSETRON HCL 4 MG/2ML IJ SOLN: 4.0000 mg | Freq: Four times a day (QID) | INTRAMUSCULAR | Status: DC | PRN

## 2020-03-26 NOTE — Discharge Instructions (Addendum)
Go to the [facility] at [facility].  Go to the MAU.  See Dr. Su Hilt there for your ectopic pregnancy.  Do not stop and eat on the way.

## 2020-03-26 NOTE — ED Notes (Signed)
Per OBGYN PA, states possible ectopic pregnancy-patient has a history of a tubal-sending for eval

## 2020-03-26 NOTE — ED Notes (Signed)
ED Provider at bedside. 

## 2020-03-26 NOTE — ED Triage Notes (Signed)
Pt presents with complaints of L sided pelvic pain x 2 days. States she had a tubal ligation last year and had a positive pregnancy test today at her OB office. Was sent to ER from OB.

## 2020-03-26 NOTE — ED Provider Notes (Signed)
[facility]-EMERGENCY DEPT Provider Note   CSN: [id number] Arrival date & time: 03/26/20  1509     History Chief Complaint  Patient presents with  . Pelvic Pain    [name] is a 40 y.o. female.  HPI Patient presents with pelvic pain.  Sent in by OB/GYN to rule out ectopic pregnancy.  Last menses was February 13.  Normally very regular.  Has not had this months menses and had a positive pregnancy test at home today.  Has had previous laparoscopic tubal ligation by fulguration.  No vaginal bleeding.    Past Medical History:  Diagnosis Date  . Asthma   . 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 Chlamydia     Patient Active Problem List   Diagnosis Date Noted  . Precordial pain 01/18/2020  . 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 Cardiomyopathy (HCC) 04/04/2018  . NSVT (nonsustained ventricular tachycardia) (HCC) 04/04/2018  . 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 Cardiac arrhythmia 03/25/2018  . Asthma 11/18/2010    Past Surgical History:  Procedure Laterality Date  . Cyst removal rt ear  yrs ago  . LAPAROSCOPIC TUBAL LIGATION Bilateral 07/07/2019   Procedure: LAPAROSCOPIC TUBAL LIGATION BY FULGURATION;  Surgeon: Hoover Browns, MD;  Location: [facility];  Service: Gynecology;  Laterality: Bilateral;  . PVC ABLATION N/A 06/30/2018   Procedure: PVC ABLATION;  Surgeon: Regan Lemming, MD;  [facility]: [facility];  Service: Cardiovascular;  Laterality: N/A;     OB History    Gravida  2   Para  2   Term  2   Preterm      AB      Living  2     SAB      IAB      Ectopic      Multiple      Live Births  2           Family History  Problem Relation Age of Onset  . Hypertension Mother   . Cancer Mother        leukemia  . Hypertension Brother   . Hypertension Maternal Grandmother   . Heart attack Maternal Grandmother 60       CABG  . Cancer  Paternal Grandmother        breast  . Diabetes Paternal Grandmother   . Heart attack Father 58    Social History   Tobacco Use  . Smoking status: Never Smoker  . Smokeless tobacco: Never Used  Vaping Use  . Vaping Use: Never used  Substance Use Topics  . Alcohol use: Yes    Comment: occ  . Drug use: No    Home Medications Prior to Admission medications   Medication Sig Start Date End Date Taking? Authorizing Provider  albuterol (PROVENTIL HFA) 108 (90 BASE) MCG/ACT inhaler Inhale 2 puffs into the lungs every 6 (six) hours as needed for wheezing. For shortness of breath or wheezing 10/27/11   Henreitta Leber, PA-C  ibuprofen (ADVIL) 800 MG tablet Take 1 tablet (800 mg total) by mouth 3 (three) times daily. 09/26/19   Roxy Horseman, PA-C  mexiletine (MEXITIL) 150 MG capsule Take 1 capsule (150 mg total) by mouth 2 (two) times daily. 03/20/20   Camnitz, Andree Coss, MD  Multiple Vitamins-Minerals (MULTIVITAMIN WITH MINERALS) tablet Take 1 tablet by mouth daily.    [provider]    Allergies  Patient has no known allergies.  Review of Systems   Review of Systems  Constitutional: Negative for appetite change.  HENT: Negative for congestion.   Respiratory: Negative for shortness of breath.   Gastrointestinal: Positive for abdominal pain.  Genitourinary: Positive for pelvic pain. Negative for vaginal bleeding and vaginal discharge.  Skin: Negative for rash.  Neurological: Negative for weakness.    Physical Exam Updated Vital Signs BP 126/75   Pulse 84   Temp 99 F (37.2 C) (Oral)   Resp 18   Ht 5\' 6"  (1.676 m)   Wt 105.2 kg   LMP 02/24/2020   SpO2 97%   BMI 37.45 kg/m   Physical Exam Vitals and nursing note reviewed.  HENT:     Head: Normocephalic.  Eyes:     Pupils: Pupils are equal, round, and reactive to light.  Cardiovascular:     Rate and Rhythm: Regular rhythm.  Abdominal:     Tenderness: There is abdominal tenderness.     Comments:  Suprapubic/left lower quadrant tenderness without rebound or guarding.  Musculoskeletal:        General: No tenderness.  Skin:    General: Skin is warm.     Capillary Refill: Capillary refill takes less than 2 seconds.  Neurological:     Mental Status: She is alert and oriented to person, place, and time.     ED Results / Procedures / Treatments   Labs (all labs ordered are listed, but only abnormal results are displayed) Labs Reviewed  HCG, QUANTITATIVE, PREGNANCY - Abnormal; Notable for the following components:      Result Value   hCG, Beta Chain, Quant, S 11,743 (*)    All other components within normal limits  RESP PANEL BY RT-PCR (FLU A&B, COVID) ARPGX2  BASIC METABOLIC PANEL  CBC  SAMPLE TO BLOOD BANK    EKG None  Radiology 04/23/2020 OB Comp < 14 Wks  Result Date: 03/26/2020 CLINICAL DATA:  Pelvic pain.  Positive pregnancy test EXAM: OBSTETRIC <14 WK 03/28/2020 AND TRANSVAGINAL OB US TECHNIQUE: Both transabdominal and transvaginal ultrasound examinations were performed for complete evaluation of the gestation as well as the maternal uterus, adnexal regions, and pelvic cul-de-sac. Transvaginal technique was performed to assess early pregnancy. COMPARISON:  None. FINDINGS: Intrauterine gestational sac: None Yolk sac:  Not Visualized. Embryo:  Not Visualized. Cardiac Activity: Not Visualized. Heart Rate:   bpm MSD:   mm    w     d CRL:    mm    w    d                  [place] EDC: Subchorionic hemorrhage:  None visualized. Maternal uterus/adnexae: Endometrium 13 mm in thickness. Small amount of fluid within the endometrial canal. Multiple follicles in the right ovary. Multiple cysts within the left ovary, the largest measuring up to 5.7 cm. No free fluid. No suspicious adnexal mass. IMPRESSION: No intrauterine pregnancy visualized. Differential considerations would include early intrauterine pregnancy too early to visualize, spontaneous abortion, or occult ectopic pregnancy. Recommend close clinical  followup and serial quantitative beta HCGs and ultrasounds. Multiple large left ovarian cysts measuring up to 5.7 cm. Electronically Signed   By: [place] M.D.   On: 03/26/2020 16:59   03/28/2020 OB Transvaginal  Result Date: 03/26/2020 CLINICAL DATA:  Pelvic pain.  Positive pregnancy test EXAM: OBSTETRIC <14 WK 03/28/2020 AND TRANSVAGINAL OB US TECHNIQUE: Both transabdominal and transvaginal ultrasound examinations were performed for complete evaluation of  the gestation as well as the maternal uterus, adnexal regions, and pelvic cul-de-sac. Transvaginal technique was performed to assess early pregnancy. COMPARISON:  None. FINDINGS: Intrauterine gestational sac: None Yolk sac:  Not Visualized. Embryo:  Not Visualized. Cardiac Activity: Not Visualized. Heart Rate:   bpm MSD:   mm    w     d CRL:    mm    w    d                  [place] EDC: Subchorionic hemorrhage:  None visualized. Maternal uterus/adnexae: Endometrium 13 mm in thickness. Small amount of fluid within the endometrial canal. Multiple follicles in the right ovary. Multiple cysts within the left ovary, the largest measuring up to 5.7 cm. No free fluid. No suspicious adnexal mass. IMPRESSION: No intrauterine pregnancy visualized. Differential considerations would include early intrauterine pregnancy too early to visualize, spontaneous abortion, or occult ectopic pregnancy. Recommend close clinical followup and serial quantitative beta HCGs and ultrasounds. Multiple large left ovarian cysts measuring up to 5.7 cm. Electronically Signed   By: Charlett Nose M.D.   On: 03/26/2020 16:59    Procedures Procedures   Medications Ordered in ED Medications - No data to display  ED Course  I have reviewed the triage vital signs and the nursing notes.  Pertinent labs & imaging results that were available during my care of the patient were reviewed by me and considered in my medical decision making (see chart for details).    MDM Rules/Calculators/A&P                           Patient presents with pelvic pain.  Positive home pregnancy test.  Previous tubal ligation.  Hemoglobin reassuring.  However has a quantitative hCG of 11,000 and ultrasound does not show any intrauterine pregnancy.  Also does not have free fluid however.  Discussed with Dr. Su Hilt.  Will transfer to MAU for further treatment.  Potentially could be surgical.  Instructed to keep n.p.o.  Discussed with patient and husband and husband will drive her over there.  Think this is overall low risk of decompensation on the way. Final Clinical Impression(s) / ED Diagnoses Final diagnoses:  Ectopic pregnancy without intrauterine pregnancy, unspecified location    Rx / DC Orders ED Discharge Orders    None       Benjiman Core, MD 03/26/20 [id number]

## 2020-03-26 NOTE — ED Notes (Signed)
Unable to obtain IV and blood. Charge RN notified and will attempt.

## 2020-03-26 NOTE — H&P (Signed)
[name] is an 40 y.o. female. Presents with mild abdominal pain and positive pregnancy test.  She had BTL by fulguration last year.  By dates she should be 4-[redacted] weeks pregnant.    Pertinent Gynecological History: Menses: flow is moderate Bleeding: na Contraception: tubal ligation DES exposure: denies Blood transfusions: none Sexually transmitted diseases: no past history Previous GYN Procedures: BTL  Last mammogram: NA Date: na Last pap: normal Date: 2021 OB History: G3, P2022   Menstrual History: Menarche age: 21 Patient's last menstrual period was 02/24/2020.    Past Medical History:  Diagnosis Date  . Asthma   . 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 Chlamydia     Past Surgical History:  Procedure Laterality Date  . Cyst removal rt ear  yrs ago  . LAPAROSCOPIC TUBAL LIGATION Bilateral 07/07/2019   Procedure: LAPAROSCOPIC TUBAL LIGATION BY FULGURATION;  Surgeon: Hoover Browns, MD;  Location: [facility];  Service: Gynecology;  Laterality: Bilateral;  . PVC ABLATION N/A 06/30/2018   Procedure: PVC ABLATION;  Surgeon: Regan Lemming, MD;  Location: [facility];  Service: Cardiovascular;  Laterality: N/A;    Family History  Problem Relation Age of Onset  . Hypertension Mother   . Cancer Mother        leukemia  . Hypertension Brother   . Hypertension Maternal Grandmother   . Heart attack Maternal Grandmother 60       CABG  . Cancer Paternal Grandmother        breast  . Diabetes Paternal Grandmother   . Heart attack Father 38    Social History:  reports that she has never smoked. She has never used smokeless tobacco. She reports current alcohol use. She reports that she does not use drugs.  Allergies: No Known Allergies  Medications Prior to Admission  Medication Sig Dispense Refill Last Dose  . ibuprofen (ADVIL) 800 MG tablet Take 1 tablet (800 mg total) by mouth  3 (three) times daily. 21 tablet 0 03/25/2020 at Unknown time  . mexiletine (MEXITIL) 150 MG capsule Take 1 capsule (150 mg total) by mouth 2 (two) times daily. 180 capsule 3 03/26/2020 at Unknown time  . albuterol (PROVENTIL HFA) 108 (90 BASE) MCG/ACT inhaler Inhale 2 puffs into the lungs every 6 (six) hours as needed for wheezing. For shortness of breath or wheezing 1 Inhaler 2 More than a month at Unknown time  . Multiple Vitamins-Minerals (MULTIVITAMIN WITH MINERALS) tablet Take 1 tablet by mouth daily.       Review of Systems  Blood pressure 124/78, pulse (!) 104, temperature 98.3 F (36.8 C), temperature source Oral, resp. rate 18, height 5\' 6"  (1.676 m), weight 105.2 kg, last menstrual period 02/24/2020, SpO2 100 %. Physical Exam  Physical Examination: General appearance - alert, well appearing, and in no distress Chest - clear to auscultation, no wheezes, rales or rhonchi, symmetric air entry Heart - normal rate and regular rhythm Abdomen - soft, nontender, nondistended, no masses or organomegaly No RT or guarding Extremities - peripheral pulses normal, no pedal edema, no clubbing or cyanosis, Homan's sign negative bilaterally  Results for orders placed or performed during the hospital encounter of 03/26/20 (from the past 24 hour(s))  hCG, quantitative, pregnancy     Status: Abnormal   Collection Time: 03/26/20  3:52 PM  Result Value Ref Range   hCG, Beta Chain, Quant, S 11,743 (H) <  5 mIU/mL  Basic metabolic panel     Status: None   Collection Time: 03/26/20  3:52 PM  Result Value Ref Range   Sodium 135 135 - 145 mmol/L   Potassium 4.4 3.5 - 5.1 mmol/L   Chloride 103 98 - 111 mmol/L   CO2 23 22 - 32 mmol/L   Glucose, Bld 85 70 - 99 mg/dL   BUN 9 6 - 20 mg/dL   Creatinine, Ser 0.32 0.44 - 1.00 mg/dL   Calcium 9.4 8.9 - 12.2 mg/dL   GFR, Estimated >48 >25 mL/min   Anion gap 9 5 - 15  Resp Panel by RT-PCR (Flu A&B, Covid) Nasopharyngeal Swab     Status: None   Collection Time:  03/26/20  4:57 PM   Specimen: Nasopharyngeal Swab; Nasopharyngeal(NP) swabs in vial transport medium  Result Value Ref Range   SARS Coronavirus 2 by RT PCR NEGATIVE NEGATIVE   Influenza A by PCR NEGATIVE NEGATIVE   Influenza B by PCR NEGATIVE NEGATIVE  Sample to Blood Bank     Status: None   Collection Time: 03/26/20  5:30 PM  Result Value Ref Range   Blood Bank Specimen SAMPLE AVAILABLE FOR TESTING    Sample Expiration      03/29/2020,2359 Performed at [facility], [address], [place], [place] [address]   CBC     Status: None   Collection Time: 03/26/20  5:30 PM  Result Value Ref Range   WBC 9.1 4.0 - 10.5 K/uL   RBC 4.29 3.87 - 5.11 MIL/uL   Hemoglobin 13.6 12.0 - 15.0 g/dL   HCT 48.8 89.1 - 69.4 %   MCV 98.8 80.0 - 100.0 fL   MCH 31.7 26.0 - 34.0 pg   MCHC 32.1 30.0 - 36.0 g/dL   RDW 50.3 88.8 - 28.0 %   Platelets 306 150 - 400 K/uL   nRBC 0.0 0.0 - 0.2 %    US OB Comp < 14 Wks  Result Date: 03/26/2020 CLINICAL DATA:  Pelvic pain.  Positive pregnancy test EXAM: OBSTETRIC <14 WK [place] AND TRANSVAGINAL OB US TECHNIQUE: Both transabdominal and transvaginal ultrasound examinations were performed for complete evaluation of the gestation as well as the maternal uterus, adnexal regions, and pelvic cul-de-sac. Transvaginal technique was performed to assess early pregnancy. COMPARISON:  None. FINDINGS: Intrauterine gestational sac: None Yolk sac:  Not Visualized. Embryo:  Not Visualized. Cardiac Activity: Not Visualized. Heart Rate:   bpm MSD:   mm    w     d CRL:    mm    w    d                  [place] EDC: Subchorionic hemorrhage:  None visualized. Maternal uterus/adnexae: Endometrium 13 mm in thickness. Small amount of fluid within the endometrial canal. Multiple follicles in the right ovary. Multiple cysts within the left ovary, the largest measuring up to 5.7 cm. No free fluid. No suspicious adnexal mass. IMPRESSION: No intrauterine pregnancy visualized. Differential  considerations would include early intrauterine pregnancy too early to visualize, spontaneous abortion, or occult ectopic pregnancy. Recommend close clinical followup and serial quantitative beta HCGs and ultrasounds. Multiple large left ovarian cysts measuring up to 5.7 cm. Electronically Signed   By: Charlett Nose M.D.   On: 03/26/2020 16:59   US OB Transvaginal  Result Date: 03/26/2020 CLINICAL DATA:  Pelvic pain.  Positive pregnancy test EXAM: OBSTETRIC <14 WK [place] AND TRANSVAGINAL OB US TECHNIQUE:  Both transabdominal and transvaginal ultrasound examinations were performed for complete evaluation of the gestation as well as the maternal uterus, adnexal regions, and pelvic cul-de-sac. Transvaginal technique was performed to assess early pregnancy. COMPARISON:  None. FINDINGS: Intrauterine gestational sac: None Yolk sac:  Not Visualized. Embryo:  Not Visualized. Cardiac Activity: Not Visualized. Heart Rate:   bpm MSD:   mm    w     d CRL:    mm    w    d                  [place] EDC: Subchorionic hemorrhage:  None visualized. Maternal uterus/adnexae: Endometrium 13 mm in thickness. Small amount of fluid within the endometrial canal. Multiple follicles in the right ovary. Multiple cysts within the left ovary, the largest measuring up to 5.7 cm. No free fluid. No suspicious adnexal mass. IMPRESSION: No intrauterine pregnancy visualized. Differential considerations would include early intrauterine pregnancy too early to visualize, spontaneous abortion, or occult ectopic pregnancy. Recommend close clinical followup and serial quantitative beta HCGs and ultrasounds. Multiple large left ovarian cysts measuring up to 5.7 cm. Electronically Signed   By: Charlett Nose M.D.   On: 03/26/2020 16:59    Assessment/Plan: Pregnancy with no evidence of IUP or ectopic on [place].  Given H/O of BTL likely an ectopic pregnancy Pt given options of OBS, MTX vs surgery.  R&B reviewed of all.  She would like MTX Will repeat BHCG and [place]  tomorrow morning if no changes she will receive MTX .  Pt and husband both agree with and understand the plan  Dymond Gutt A Idriss Quackenbush 03/26/2020, 10:17 PM

## 2020-03-27 ENCOUNTER — Observation Stay (HOSPITAL_COMMUNITY): Payer: BC Managed Care – PPO

## 2020-03-27 ENCOUNTER — Encounter (HOSPITAL_COMMUNITY): Payer: Self-pay | Admitting: Obstetrics and Gynecology

## 2020-03-27 ENCOUNTER — Encounter (HOSPITAL_COMMUNITY)
Admission: EM | Disposition: A | Discharge status: Home / Self Care | Payer: Self-pay | Source: Home / Self Care | Attending: Emergency Medicine

## 2020-03-27 ENCOUNTER — Observation Stay (HOSPITAL_COMMUNITY): Payer: BC Managed Care – PPO | Admitting: Anesthesiology

## 2020-03-27 HISTORY — PX: LAPAROSCOPIC OVARIAN CYSTECTOMY: SHX6248

## 2020-03-27 HISTORY — PX: LAPAROSCOPIC BILATERAL SALPINGECTOMY: SHX5889

## 2020-03-27 LAB — COMPREHENSIVE METABOLIC PANEL
ALT: 11 U/L (ref 0–44)
AST: 19 U/L (ref 15–41)
Albumin: 3.8 g/dL (ref 3.5–5.0)
Alkaline Phosphatase: 53 U/L (ref 38–126)
Anion gap: 6 (ref 5–15)
BUN: 9 mg/dL (ref 6–20)
CO2: 22 mmol/L (ref 22–32)
Calcium: 9.1 mg/dL (ref 8.9–10.3)
Chloride: 107 mmol/L (ref 98–111)
Creatinine, Ser: 0.8 mg/dL (ref 0.44–1.00)
GFR, Estimated: >60 mL/min (ref >60)
Glucose, Bld: 98 mg/dL (ref 70–99)
Potassium: 3.9 mmol/L (ref 3.5–5.1)
Sodium: 135 mmol/L (ref 135–145)
Total Bilirubin: 0.8 mg/dL (ref 0.3–1.2)
Total Protein: 7.3 g/dL (ref 6.5–8.1)

## 2020-03-27 LAB — TYPE AND SCREEN
ABO/RH(D): B POS
Antibody Screen: NEGATIVE

## 2020-03-27 LAB — CBC
HCT: 39.9 % (ref 36.0–46.0)
Hemoglobin: 13.2 g/dL (ref 12.0–15.0)
MCH: 32.6 pg (ref 26.0–34.0)
MCHC: 33.1 g/dL (ref 30.0–36.0)
MCV: 98.5 fL (ref 80.0–100.0)
Platelets: 301 10*3/uL (ref 150–400)
RBC: 4.05 MIL/uL (ref 3.87–5.11)
RDW: 12.6 % (ref 11.5–15.5)
WBC: 6.4 10*3/uL (ref 4.0–10.5)
nRBC: 0 % (ref 0.0–0.2)

## 2020-03-27 SURGERY — SALPINGECTOMY, BILATERAL, LAPAROSCOPIC
Anesthesia: General | Laterality: Bilateral

## 2020-03-27 MED ORDER — FENTANYL CITRATE (PF) 250 MCG/5ML IJ SOLN
INTRAMUSCULAR | Status: AC
  Filled 2020-03-27: qty 5

## 2020-03-27 MED ORDER — SCOPOLAMINE 1 MG/3DAYS TD PT72: 1.0000 | MEDICATED_PATCH | TRANSDERMAL | Status: DC

## 2020-03-27 MED ORDER — FENTANYL CITRATE (PF) 100 MCG/2ML IJ SOLN
25.0000 ug | INTRAMUSCULAR | Status: DC | PRN
Start: 2020-03-27 — End: 2020-03-27

## 2020-03-27 MED ORDER — KETOROLAC TROMETHAMINE 30 MG/ML IJ SOLN
INTRAMUSCULAR | Status: AC
  Administered 2020-03-27: 30 mg via INTRAVENOUS
  Filled 2020-03-27: qty 1

## 2020-03-27 MED ORDER — ACETAMINOPHEN 500 MG PO TABS: 1000.0000 mg | ORAL_TABLET | Freq: Once | ORAL | Status: AC

## 2020-03-27 MED ORDER — ACETAMINOPHEN 500 MG PO TABS
ORAL_TABLET | ORAL | Status: AC
  Administered 2020-03-27: 1000 mg via ORAL
  Filled 2020-03-27: qty 2

## 2020-03-27 MED ORDER — IBUPROFEN 800 MG PO TABS: 800.0000 mg | ORAL_TABLET | Freq: Three times a day (TID) | ORAL | 3 refills | Status: AC | PRN

## 2020-03-27 MED ORDER — PROMETHAZINE HCL 12.5 MG PO TABS: 12.5000 mg | ORAL_TABLET | Freq: Four times a day (QID) | ORAL | 0 refills | Status: DC | PRN

## 2020-03-27 MED ORDER — SCOPOLAMINE 1 MG/3DAYS TD PT72
MEDICATED_PATCH | TRANSDERMAL | Status: AC
  Administered 2020-03-27: 1.5 mg via TRANSDERMAL
  Filled 2020-03-27: qty 1

## 2020-03-27 MED ORDER — HYDROCODONE-ACETAMINOPHEN 5-325 MG PO TABS: 1.0000 | ORAL_TABLET | Freq: Four times a day (QID) | ORAL | 0 refills | Status: DC | PRN

## 2020-03-27 MED ORDER — ROCURONIUM BROMIDE 10 MG/ML (PF) SYRINGE
PREFILLED_SYRINGE | INTRAVENOUS | Status: AC
  Filled 2020-03-27: qty 10

## 2020-03-27 MED ORDER — LIDOCAINE 2% (20 MG/ML) 5 ML SYRINGE
INTRAMUSCULAR | Status: DC | PRN
  Administered 2020-03-27: 80 mg via INTRAVENOUS

## 2020-03-27 MED ORDER — AMISULPRIDE (ANTIEMETIC) 5 MG/2ML IV SOLN: 10.0000 mg | Freq: Once | INTRAVENOUS | Status: DC | PRN

## 2020-03-27 MED ORDER — ROCURONIUM BROMIDE 10 MG/ML (PF) SYRINGE
PREFILLED_SYRINGE | INTRAVENOUS | Status: DC | PRN
  Administered 2020-03-27: 80 mg via INTRAVENOUS

## 2020-03-27 MED ORDER — PROMETHAZINE HCL 25 MG/ML IJ SOLN
6.2500 mg | INTRAMUSCULAR | Status: DC | PRN
Start: 2020-03-27 — End: 2020-03-27

## 2020-03-27 MED ORDER — MIDAZOLAM HCL 2 MG/2ML IJ SOLN
INTRAMUSCULAR | Status: AC
  Filled 2020-03-27: qty 2

## 2020-03-27 MED ORDER — SODIUM CHLORIDE 0.9 % IR SOLN
Status: DC | PRN
  Administered 2020-03-27: 3000 mL

## 2020-03-27 MED ORDER — PHENYLEPHRINE 40 MCG/ML (10ML) SYRINGE FOR IV PUSH (FOR BLOOD PRESSURE SUPPORT)
PREFILLED_SYRINGE | INTRAVENOUS | Status: AC
  Filled 2020-03-27: qty 20

## 2020-03-27 MED ORDER — ONDANSETRON HCL 4 MG/2ML IJ SOLN
INTRAMUSCULAR | Status: AC
  Filled 2020-03-27: qty 2

## 2020-03-27 MED ORDER — OXYCODONE HCL 5 MG/5ML PO SOLN: 5.0000 mg | Freq: Once | ORAL | Status: DC | PRN

## 2020-03-27 MED ORDER — OXYCODONE HCL 5 MG PO TABS: 5.0000 mg | ORAL_TABLET | Freq: Once | ORAL | Status: DC | PRN

## 2020-03-27 MED ORDER — FENTANYL CITRATE (PF) 100 MCG/2ML IJ SOLN
INTRAMUSCULAR | Status: DC | PRN
  Administered 2020-03-27: 100 ug via INTRAVENOUS
  Administered 2020-03-27 (×2): 50 ug via INTRAVENOUS

## 2020-03-27 MED ORDER — LACTATED RINGERS IV SOLN: INTRAVENOUS | Status: DC

## 2020-03-27 MED ORDER — DEXAMETHASONE SODIUM PHOSPHATE 10 MG/ML IJ SOLN
INTRAMUSCULAR | Status: AC
  Filled 2020-03-27: qty 1

## 2020-03-27 MED ORDER — LACTATED RINGERS IV SOLN: INTRAVENOUS | Status: DC | PRN

## 2020-03-27 MED ORDER — DEXAMETHASONE SODIUM PHOSPHATE 10 MG/ML IJ SOLN
INTRAMUSCULAR | Status: DC | PRN
  Administered 2020-03-27: 10 mg via INTRAVENOUS

## 2020-03-27 MED ORDER — KETOROLAC TROMETHAMINE 30 MG/ML IJ SOLN: 30.0000 mg | Freq: Once | INTRAMUSCULAR | Status: AC

## 2020-03-27 MED ORDER — PROPOFOL 10 MG/ML IV BOLUS
INTRAVENOUS | Status: DC | PRN
  Administered 2020-03-27: 150 mg via INTRAVENOUS

## 2020-03-27 MED ORDER — BUPIVACAINE HCL (PF) 0.25 % IJ SOLN
INTRAMUSCULAR | Status: AC
  Filled 2020-03-27: qty 30

## 2020-03-27 MED ORDER — MIDAZOLAM HCL 5 MG/5ML IJ SOLN
INTRAMUSCULAR | Status: DC | PRN
  Administered 2020-03-27: 2 mg via INTRAVENOUS

## 2020-03-27 MED ORDER — BUPIVACAINE HCL 0.25 % IJ SOLN
INTRAMUSCULAR | Status: DC | PRN
  Administered 2020-03-27: 13 mL

## 2020-03-27 MED ORDER — PHENYLEPHRINE 40 MCG/ML (10ML) SYRINGE FOR IV PUSH (FOR BLOOD PRESSURE SUPPORT)
PREFILLED_SYRINGE | INTRAVENOUS | Status: DC | PRN
  Administered 2020-03-27: 120 ug via INTRAVENOUS
  Administered 2020-03-27 (×2): 80 ug via INTRAVENOUS
  Administered 2020-03-27: 120 ug via INTRAVENOUS

## 2020-03-27 MED ORDER — ONDANSETRON HCL 4 MG/2ML IJ SOLN
INTRAMUSCULAR | Status: DC | PRN
  Administered 2020-03-27: 4 mg via INTRAVENOUS

## 2020-03-27 MED ORDER — PROPOFOL 10 MG/ML IV BOLUS
INTRAVENOUS | Status: AC
  Filled 2020-03-27: qty 20

## 2020-03-27 MED ORDER — LIDOCAINE 2% (20 MG/ML) 5 ML SYRINGE
INTRAMUSCULAR | Status: AC
  Filled 2020-03-27: qty 5

## 2020-03-27 MED ORDER — CHLORHEXIDINE GLUCONATE 0.12 % MT SOLN
15.0000 mL | Freq: Once | OROMUCOSAL | Status: AC
  Administered 2020-03-27: 15 mL via OROMUCOSAL
  Filled 2020-03-27: qty 15

## 2020-03-27 SURGICAL SUPPLY — 30 items
APPLICATOR ARISTA FLEXITIP XL (MISCELLANEOUS) ×6 IMPLANT
DEFOGGER SCOPE WARMER CLEARIFY (MISCELLANEOUS) ×3 IMPLANT
DERMABOND ADHESIVE PROPEN (GAUZE/BANDAGES/DRESSINGS) ×1
DERMABOND ADVANCED (GAUZE/BANDAGES/DRESSINGS) ×1
DERMABOND ADVANCED .7 DNX12 (GAUZE/BANDAGES/DRESSINGS) ×2 IMPLANT
DERMABOND ADVANCED .7 DNX6 (GAUZE/BANDAGES/DRESSINGS) ×2 IMPLANT
DURAPREP 26ML APPLICATOR (WOUND CARE) ×3 IMPLANT
GLOVE BIO SURGEON STRL SZ 6.5 (GLOVE) ×3 IMPLANT
GLOVE SURG UNDER POLY LF SZ7 (GLOVE) ×12 IMPLANT
GOWN STRL REUS W/ TWL LRG LVL3 (GOWN DISPOSABLE) ×4 IMPLANT
GOWN STRL REUS W/TWL LRG LVL3 (GOWN DISPOSABLE) ×6
HEMOSTAT ARISTA ABSORB 3G PWDR (HEMOSTASIS) ×3 IMPLANT
KIT TURNOVER KIT B (KITS) ×3 IMPLANT
LIGASURE VESSEL 5MM BLUNT TIP (ELECTROSURGICAL) ×3 IMPLANT
PACK LAPAROSCOPY BASIN (CUSTOM PROCEDURE TRAY) ×3 IMPLANT
PACK TRENDGUARD 450 HYBRID PRO (MISCELLANEOUS) ×2 IMPLANT
POUCH LAPAROSCOPIC INSTRUMENT (MISCELLANEOUS) ×3 IMPLANT
POUCH SPECIMEN RETRIEVAL 10MM (ENDOMECHANICALS) ×6 IMPLANT
PROTECTOR NERVE ULNAR (MISCELLANEOUS) ×6 IMPLANT
SET IRRIG TUBING LAPAROSCOPIC (IRRIGATION / IRRIGATOR) ×3 IMPLANT
SET TUBE SMOKE EVAC HIGH FLOW (TUBING) ×3 IMPLANT
SLEEVE ENDOPATH XCEL 5M (ENDOMECHANICALS) ×3 IMPLANT
SUT MNCRL AB 4-0 PS2 18 (SUTURE) ×3 IMPLANT
SUT MON AB 4-0 PS1 27 (SUTURE) ×3 IMPLANT
SUT VICRYL 0 UR6 27IN ABS (SUTURE) ×3 IMPLANT
TOWEL GREEN STERILE FF (TOWEL DISPOSABLE) ×6 IMPLANT
TRAY FOLEY W/BAG SLVR 14FR (SET/KITS/TRAYS/PACK) ×3 IMPLANT
TRENDGUARD 450 HYBRID PRO PACK (MISCELLANEOUS) ×3
TROCAR XCEL NON-BLD 5MMX100MML (ENDOMECHANICALS) ×3 IMPLANT
WARMER LAPAROSCOPE (MISCELLANEOUS) ×3 IMPLANT

## 2020-03-27 NOTE — Anesthesia Preprocedure Evaluation (Addendum)
Anesthesia Evaluation  Patient identified by MRN, date of birth, ID band Patient awake    Reviewed: Allergy & Precautions, NPO status , Patient's Chart, lab work & pertinent test results  History of Anesthesia Complications Negative for: history of anesthetic complications  Airway Mallampati: II  TM Distance: >3 FB Neck ROM: Full    Dental  (+) Dental Advisory Given, Teeth Intact, Chipped,    Pulmonary asthma ,    Pulmonary exam normal        Cardiovascular Exercise Tolerance: Good Normal cardiovascular exam+ dysrhythmias (PVCs)    '21 TTE - EF 50 to 55%. The left ventricle demonstrates global hypokinesis. EF appears low normal to mildly reduced. Frequent ventricular ectopy appears to be affect LV function. Trivial pericardial effusion is present. Mild mitral valve regurgitation. There is redundancy of the interatrial septum.     Neuro/Psych negative neurological ROS  negative psych ROS   GI/Hepatic negative GI ROS, Neg liver ROS,   Endo/Other   Obesity   Renal/GU negative Renal ROS     Musculoskeletal negative musculoskeletal ROS (+)   Abdominal   Peds  Hematology negative hematology ROS (+)   Anesthesia Other Findings    Reproductive/Obstetrics negative OB ROS                             Anesthesia Physical  Anesthesia Plan  ASA: II and emergent  Anesthesia Plan: General   Post-op Pain Management:    Induction: Intravenous  PONV Risk Score and Plan: 4 or greater and Treatment may vary due to age or medical condition, Ondansetron, Midazolam, Dexamethasone and Scopolamine patch - Pre-op  Airway Management Planned: Oral ETT  Additional Equipment: None  Intra-op Plan:   Post-operative Plan: Extubation in OR  Informed Consent: I have reviewed the patients History and Physical, chart, labs and discussed the procedure including the risks, benefits and alternatives for the  proposed anesthesia with the patient or authorized representative who has indicated his/her understanding and acceptance.     Dental advisory given  Plan Discussed with: CRNA, Anesthesiologist and Surgeon  Anesthesia Plan Comments:         Anesthesia Quick Evaluation

## 2020-03-27 NOTE — Anesthesia Postprocedure Evaluation (Signed)
Anesthesia Post Note  Patient: [name]  Procedure(s) Performed: LAPAROSCOPIC BILATERAL SALPINGECTOMY, EVACUATION OF HEMOPERITONEUM (Bilateral ) LAPAROSCOPIC BILATERAL OVARIAN CYSTECTOMY     Patient location during evaluation: PACU Anesthesia Type: General Level of consciousness: awake and alert Pain management: pain level controlled Vital Signs Assessment: post-procedure vital signs reviewed and stable Respiratory status: spontaneous breathing, nonlabored ventilation and respiratory function stable Cardiovascular status: blood pressure returned to baseline and stable Postop Assessment: no apparent nausea or vomiting Anesthetic complications: no   No complications documented.  Last Vitals:  Vitals:   03/27/20 1331 03/27/20 1346  BP: 111/72 101/85  Pulse: 80   Resp: 13 12  Temp:    SpO2: 100% 100%    Last Pain:  Vitals:   03/27/20 1346  TempSrc:   PainSc: 3                  Candra R Ileen Kahre

## 2020-03-27 NOTE — Op Note (Signed)
OPERATIVE NOTE  [name]  DOB:    [birth year]  MRN:    [id number]  CSN:    [id number]  Date of Surgery:  03/27/2020  Preop Diagnosis: ectopic pregnancy   Postop Diagnosis: Right ectopic pregnancy and bilateral ovarian cysts  Procedure: Diagnostic laparoscopy, removal of hematoperitoneum; right salpingectomy, bilateral ovarian cystectomy  Anesthesia: General ETA  Surgeon: Delila Spence. Mora Appl, M.D.  Assistant: None  Disposition: The patient presents with the above-mentioned diagnosis. She understands the indications for surgical procedure.  She also understands the alternative treatment options. She accepts the risk of, but not limited to, anesthetic complications, bleeding, infections, and possible damage to the surrounding organs.  Indication: Patient with positive beta HCG with ultrasound showing no intrauterine pregnancy and right adnexal mass consistent with ectopic pregnancy  Findings: Approximately 200 mL of hemoperitoneumlarge, ischemic cystic structure, identified to be the right fallopian tube Large simple appearing cysts with clear fluid on ovaries bilaterally normal uterus normal left fallopian tube  Pathology: right fallopian tube with possible embryonic structure; bilateral ovarian cyst walls.   Fluids: 1350 mL  UOP: 400 mL  EBL:  200 mL  Complications: None  Procedure: The patient was taken to the operating room after the risks, benefits, alternatives, complications, treatment options, and expected outcomes were discussed with the patient. The patient verbalized understanding, the patient concurred with the proposed plan and consent signed and witnessed. The patient was taken to the Operating Room, identified as [name] and the procedure verified as laparoscopic ovarian cystectomy. A Time Out was held and the above information confirmed.  Once patient was under adequate general anesthesia, she was placed in dorsal lithotomy position in [place]  stirrups. She was prepped and draped in routine fashion. A  Grave's speculum was placed in the vagina and the anterior lip of the cervix was held with a single tooth tenaculum, the [place] uterine manipulator was placed and the tenaculum removed. The  Speculum was removed and the bladder catheterized with a foley. Surgeons gown and gloves changed and attention was turned to the abdomen. The subumbilical area was injected with half percent Marcaine.  An incision was made in the umbilicus with 11 blade scalpel after infiltration with 0.25% Marcaine. A 73mm Excel Visiport trocar was used with l60mm 0 degree laparoscope attached to perform direct entry into the patient's abdomen. Direct video visualization confirmed entry and pneumoperitoneum was obtained using approximately 3L CO2 gas.  The patient was placed in steep Trendelenburg position. A small incision was made and a 5 mm trocar was inserted into the abdominal cavity along the left pelvis and upper left abdomen under direct visualization. There was no injury noted with placement of trocars.  With graspers and suction in hand, the large ischemic mass was manipulated and assessed. It became apparent that both ovaries were entirely normal and this ischemic mass was infact part of the right fallopnian tube. With further manipulation, the site of attachement was found. Specifically, the proximal fallopian tube was in fact normal, however approximately midway long there was an transition point (which is suspecious for a torsion of the tube) where the remainder of the tube distally became ischemic and swollen into a large black cystic mass approximately 3 cm long in the widest (transverse) direction. No fimbrae could be identified as it was likely engulfed by the significant size of this presumed hematosalpinx.  Using the 20mm Ligasure, the mesosalpinx incorporating the large ischemic mass was transected along until the fallopian tube was transected off  the uterine  corpus. The tube and contents were placed in the anterior cul de sac to be retrieved later. The scissors were then used to spot-burn the proximal end to ensure hemostasis. There was a simple appearing cyst on the right ovary and given the size of the  mass, the decision to cut and evacuate its contents was made, such that it could be removed more easily.The monopolar scissors were used to cut an opening into its contents and suction was used to evacuate it. Old, black, clotted blood was evacuated. A second incision was then made which revealed staw-like fluid, suggesting either a cystic component  After evacuating all the contents, the size of the mass had significantly reduced.  The cyst was transected off the ovary using the Ligasure.   Attention was then turned to removing the mass from the abdomen. The 0 degree 5 mm laparoscope was used for right lower quadrant port visualization and an endocatch device was then introduced into the abdomen through the 11 mm umbilical port. Both the right fallopian tube and the right ovarian mass was then placed inside and the device was closed. The port was then removed being carful to hold on to the end of the endocatch bag, and with gentle manipulation, suction, and dissection above, and direct visualization laparoscopically from below, the mass was eventually teased out of the abdomen through the 11 mm opening. The specimen was then sent to pathology.  Following this, attention was turned back to the abdomen. Careful inspection of both ovaries, the left tube, the remaining proximaly right tube, and the uterus. There was another simple appearing left ovarian cyst and this was incised emitting clear fluid. The cyst wall was transected off the left ovary and removed as above procedure using an endocatch bag.  Irrigation was used to remove the remaining blood and serous fluid in the pelvis. There was no active bleeding. After inspecting the entire abdomen one last time, the  ports were removed one at a time under direct laparoscopic visualization. The CO2 gas was then allowed to escape and the subumbilical port was removed with the laparoscope in place allowing visualization of any possible bleeders. None were noted. The skin incisions were approximated with 3-0 monocryl sutures in a subcuticular manner. Hemostasis was again found to be adequate. Dermabond was placed over all incisions. The uterine manipulator was removed from the uterus and cervix while assessing for any tinaculum site bleeding. None was noted. The foley catheter was also removed.   All sponges, instruments, and sharps were counted and correct at the end of the procedure. Estimated blood loss was minimal. The patient tolerated the procedure well and was transported to the recovery room in stable condition.   Naoma Diener Demetri Kerman

## 2020-03-27 NOTE — Transfer of Care (Signed)
Immediate Anesthesia Transfer of Care Note  Patient: [name]  Procedure(s) Performed: LAPAROSCOPIC BILATERAL SALPINGECTOMY, EVACUATION OF HEMOPERITONEUM (Bilateral ) LAPAROSCOPIC BILATERAL OVARIAN CYSTECTOMY  Patient Location: PACU  Anesthesia Type:General  Level of Consciousness: oriented, drowsy and patient cooperative  Airway & Oxygen Therapy: Patient Spontanous Breathing and Patient connected to nasal cannula oxygen  Post-op Assessment: Report given to RN and Post -op Vital signs reviewed and stable  Post vital signs: Reviewed  Last Vitals:  Vitals Value Taken Time  BP 116/67 03/27/20 1301  Temp    Pulse 95 03/27/20 1303  Resp 16 03/27/20 1303  SpO2 98 % 03/27/20 1303  Vitals shown include unvalidated device data.  Last Pain:  Vitals:   03/27/20 0802  TempSrc: Oral  PainSc: 0-No pain         Complications: No complications documented.

## 2020-03-27 NOTE — Progress Notes (Signed)
MD PREOPERATIVE NOTE  Subjective: Patient is a 40 y.o. gravida [id number] with positive pregnancy test who presented to the ER with abdominal pain.  She has history of bilateral bilateral tubal ligation via fulguration last year.  Ultrasound demonstrates "ring of fire" in the right adnexa no intrauterine pregnancy consistent with ectopic pregnancy. Patient denies shortness of breath, no chest pain, no light-headedness or dizziness. She denies nausea or vomiting.   Pertinent Gyn History: Gynecologic care is up to date   Patient Active Problem List   Diagnosis Date Noted  . Abdominal pain complicating pregnancy 03/26/2020  . Precordial pain 01/18/2020  . 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 Cardiomyopathy (HCC) 04/04/2018  . NSVT (nonsustained ventricular tachycardia) (HCC) 04/04/2018  . 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 Cardiac arrhythmia 03/25/2018  . Asthma 11/18/2010   Past Medical History:  Diagnosis Date  . Asthma   . 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 Chlamydia     Past Surgical History:  Procedure Laterality Date  . Cyst removal rt ear  yrs ago  . LAPAROSCOPIC TUBAL LIGATION Bilateral 07/07/2019   Procedure: LAPAROSCOPIC TUBAL LIGATION BY FULGURATION;  Surgeon: Hoover Browns, MD;  Location: [facility];  Service: Gynecology;  Laterality: Bilateral;  . PVC ABLATION N/A 06/30/2018   Procedure: PVC ABLATION;  Surgeon: Regan Lemming, MD;  Location: [facility];  Service: Cardiovascular;  Laterality: N/A;    Medications Prior to Admission  Medication Sig Dispense Refill Last Dose  . ibuprofen (ADVIL) 800 MG tablet Take 1 tablet (800 mg total) by mouth 3 (three) times daily. 21 tablet 0 03/25/2020 at Unknown time  . mexiletine (MEXITIL) 150 MG capsule Take 1 capsule (150 mg total) by mouth 2 (two) times daily. 180 capsule 3 03/26/2020 at Unknown time  . albuterol (PROVENTIL HFA) 108 (90  BASE) MCG/ACT inhaler Inhale 2 puffs into the lungs every 6 (six) hours as needed for wheezing. For shortness of breath or wheezing 1 Inhaler 2 More than a month at Unknown time  . Multiple Vitamins-Minerals (MULTIVITAMIN WITH MINERALS) tablet Take 1 tablet by mouth daily.      No Known Allergies  Social History   Tobacco Use  . Smoking status: Never Smoker  . Smokeless tobacco: Never Used  Substance Use Topics  . Alcohol use: Yes    Comment: occ    Family History  Problem Relation Age of Onset  . Hypertension Mother   . Cancer Mother        leukemia  . Hypertension Brother   . Hypertension Maternal Grandmother   . Heart attack Maternal Grandmother 60       CABG  . Cancer Paternal Grandmother        breast  . Diabetes Paternal Grandmother   . Heart attack Father 59    Review of Systems As per HPI   Objective: Vital signs in last 24 hours: Temp:  [98.3 F (36.8 C)-99 F (37.2 C)] 98.4 F (36.9 C) (03/16 0802) Pulse Rate:  [61-104] 61 (03/16 0802) Resp:  [16-18] 18 (03/16 0802) BP: (103-130)/(65-91) 103/65 (03/16 0802) SpO2:  [97 %-100 %] 100 % (03/15 2106) Weight:  [105.2 kg] 105.2 kg (03/15 1514)  Abdomen: Soft, mild tenderness to palpation right lower quadrant, no rebound or guarding.  GU: Pelvic exam deferred.  Imaging Ultrasound:   IMPRESSION: 1. No visible intrauterine pregnancy. 2. 3 cm ring like structure at the  right adnexa which could be intra-ovarian or ectopic pregnancy. No better candidate for ectopic pregnancy in the remainder of the scan. 3. Negative for pelvic fluid. 4. Multi cystic enlargement the left ovary. If no outside comparison imaging, recommend follow-up.  Assessment/Plan: 40 year old with right ectopic pregnancy Discussed with patient treatment options including methotrexate therapy versus surgery.  Patient desires surgery via bilateral salpingectomy.  I reviewed risks of surgery with patient to include hemorrhage, infection, injury  to internal organs or vessels requiring possible ex-lap and repair.  Patient voiced understanding and all inquiries answered. Patient would like to proceed.  Consent signed, witnessed and placed into chart.    Naoma Diener Norvin Ohlin

## 2020-03-27 NOTE — MAU Note (Signed)
OR Transport arrived to transport patient to OR.

## 2020-03-27 NOTE — MAU Note (Signed)
Dr. Mora Appl, at bedside. Informed consent given and signed.

## 2020-03-27 NOTE — MAU Note (Signed)
Alesia Banda, OR Charge Nurse, stated transport was being sent to retrieve patient.  Informed OR Charge Nurse that the patient's orders for surgery were not placed yet and that the Type and Screen was complete but still pending. OR Charge Nurse stated that that was fine and she would get it "situated" there. Or Charge Nurse informed that patient would also be prepped for surgery when she arrived and no need to prep in MAU.

## 2020-03-27 NOTE — Anesthesia Procedure Notes (Signed)
Procedure Name: Intubation Date/Time: 03/27/2020 11:21 AM Performed by: Lovie Chol, CRNA Pre-anesthesia Checklist: Patient identified, Emergency Drugs available, Suction available and Patient being monitored Patient Re-evaluated:Patient Re-evaluated prior to induction Oxygen Delivery Method: Circle System Utilized Preoxygenation: Pre-oxygenation with 100% oxygen Induction Type: IV induction Ventilation: Mask ventilation without difficulty Laryngoscope Size: Miller and 2 Grade View: Grade I Tube type: Oral Tube size: 7.0 mm Number of attempts: 1 Airway Equipment and Method: Stylet Placement Confirmation: ETT inserted through vocal cords under direct vision,  positive ETCO2 and breath sounds checked- equal and bilateral Secured at: 20 cm Tube secured with: Tape Dental Injury: Teeth and Oropharynx as per pre-operative assessment

## 2020-03-27 NOTE — Discharge Summary (Signed)
Physician Discharge Summary  Patient ID: [name] MRN: [id number] DOB/AGE: 08-17-1980 40 y.o.  Admit date: 03/26/2020 Discharge date: 03/27/2020  Admission Diagnoses:  Abdominal pain complicating pregnancy Discharge Diagnoses:  Right ectopic pregnancy Hemoperitoneum Bilateral ovarian cysts   Discharged Condition: good  Hospital Course: Patient admitted to GYN for observation for positive beta HCG and No IUP seen on [place].  Repeat US confirmed right adnexal mass with "ring of fire" appearance c/w right ectopic pregnancy. Patient high risk for ectopic due to h/o bilateral tubal ligation via fulguration last year.  Patient taken to OR on HD#1-2 and laparoscopic findings showed hemoperitoneum and right tubal pregnancy and bilateral benign appearing ovarian cysts.   Consults: None  Significant Diagnostic Studies: Ultrasound, Beta HCG, Type and Screen, CBC  Treatments: IV hydration, analgesia: Dilaudid and surgery: Laparoscopic bilateral salpingectomy, and bilateral ovarian cystectomy with evacuation of hemoperitoneum  Discharge Exam: Blood pressure 116/67, pulse 91, temperature 97.7 F (36.5 C), resp. rate 16, height 5' 5.98" (1.676 m), weight 105.2 kg, last menstrual period 02/24/2020, SpO2 100 %. Patient stable post op  Disposition: Discharge disposition: 01-Home or Self Care       Discharge Instructions    Call MD for:   Complete by: As directed    Abnormal foul smelling vaginal discharge or heavy vaginal bleeding (soaking a pad an hour)   Call MD for:  difficulty breathing, headache or visual disturbances   Complete by: As directed    Call MD for:  extreme fatigue   Complete by: As directed    Call MD for:  hives   Complete by: As directed    Call MD for:  persistant dizziness or light-headedness   Complete by: As directed    Call MD for:  persistant nausea and vomiting   Complete by: As directed    Call MD for:  redness, tenderness, or signs of infection (pain,  swelling, redness, odor or green/yellow discharge around incision site)   Complete by: As directed    Call MD for:  severe uncontrolled pain   Complete by: As directed    Call MD for:  temperature >100.4   Complete by: As directed    Diet - low sodium heart healthy   Complete by: As directed    Discharge instructions   Complete by: As directed    If you have not made a follow up post operative visit please call [place] office at [phone] or if you have any questions or concerns.  CALL DR. Jkayla Spiewak FOR ANY POST OP EMERGENCIES [phone]   Discharge wound care:   Complete by: As directed    Your incisions have skin glue which will dissolve over time.  You may shower, no baths. Don't use lotions or savs on incision sites.   Driving Restrictions   Complete by: As directed    NO driving while taking narcotics and for at least a week   Increase activity slowly   Complete by: As directed    Lifting restrictions   Complete by: As directed    No lifting anything heavier than a gallon of milk for at least 2 weeks   Sexual Activity Restrictions   Complete by: As directed    No intercourse for at least 2 weeks     Allergies as of 03/27/2020   No Known Allergies     Medication List    TAKE these medications   albuterol 108 (90 Base) MCG/ACT inhaler Commonly known as: Proventil HFA Inhale 2  puffs into the lungs every 6 (six) hours as needed for wheezing. For shortness of breath or wheezing   HYDROcodone-acetaminophen 5-325 MG tablet Commonly known as: NORCO/VICODIN Take 1-2 tablets by mouth every 6 (six) hours as needed for moderate pain.   ibuprofen 800 MG tablet Commonly known as: ADVIL Take 1 tablet (800 mg total) by mouth every 8 (eight) hours as needed for moderate pain. What changed:   when to take this  reasons to take this   mexiletine 150 MG capsule Commonly known as: MEXITIL Take 1 capsule (150 mg total) by mouth 2 (two) times daily.   multivitamin with  minerals tablet Take 1 tablet by mouth daily.   promethazine 12.5 MG tablet Commonly known as: PHENERGAN Take 1 tablet (12.5 mg total) by mouth every 6 (six) hours as needed for nausea or vomiting.            Discharge Care Instructions  (From admission, onward)         Start     Ordered   03/27/20 0000  Discharge wound care:       Comments: Your incisions have skin glue which will dissolve over time.  You may shower, no baths. Don't use lotions or savs on incision sites.   03/27/20 1304           Signed: Naoma Diener Kamorie Aldous 03/27/2020, 1:05 PM

## 2020-03-28 ENCOUNTER — Encounter (HOSPITAL_COMMUNITY): Payer: Self-pay | Admitting: Obstetrics & Gynecology

## 2020-03-28 LAB — SURGICAL PATHOLOGY

## 2020-09-19 ENCOUNTER — Encounter: Payer: Self-pay | Admitting: Cardiology

## 2020-09-19 ENCOUNTER — Ambulatory Visit: Payer: BC Managed Care – PPO | Admitting: Cardiology

## 2020-09-19 ENCOUNTER — Other Ambulatory Visit: Payer: Self-pay

## 2020-09-19 VITALS — BP 114/62 | HR 83 | Ht 65.0 in | Wt 228.0 lb

## 2020-09-19 DIAGNOSIS — I493 Ventricular premature depolarization: Secondary | ICD-10-CM | POA: Diagnosis not present

## 2020-09-19 NOTE — Progress Notes (Signed)
Leah Office Note   Date:  09/19/2020   ID:  ELOISA Gregory, [name], Leah [id number]  PCP:  Gordy Clement, Leah  Cardiologist:  Rosemary Holms Primary Electrophysiologist:  Brizeyda Holtmeyer [name], Leah    Chief Complaint: PVC   History of Present Illness: [name] is a 40 y.o. female who is being seen today for the evaluation of PVC at the request of [name], Side M, Leah. Presenting today for Leah evaluation.  She has a history significant for PVCs with a PVC induced cardiomyopathy.  She had an attempted ablation, but unfortunately her PVCs have continued.  She is currently on mexiletine.  She has had an eventful last few months.  In the spring, she had an ectopic pregnancy and had her fallopian tube removed.  She had had a prior tubal ligation.  Her husband also tore his ACL over the summer and she has been helping him with rehab.  Today, denies symptoms of palpitations, chest pain, shortness of breath, orthopnea, PND, lower extremity edema, claudication, dizziness, presyncope, syncope, bleeding, or neurologic sequela. The patient is tolerating medications without difficulties.  Fortunately she feels well.  She has no chest pain or shortness of breath.  Is able do all of her daily activities.  She is unaware of her PVCs.  She does not have any acute concerns at this time.   Past Medical History:  Diagnosis Date   Asthma    Cardiomyopathy (HCC)    induced by pvc's improved per 01-18-2019 echo [facility] note 02-03-2019   Chlamydia    Past Surgical History:  Procedure Laterality Date   Cyst removal rt ear  yrs ago   LAPAROSCOPIC BILATERAL SALPINGECTOMY Bilateral 03/27/2020   Procedure: LAPAROSCOPIC BILATERAL SALPINGECTOMY, EVACUATION OF HEMOPERITONEUM;  Surgeon: Essie Hart, Leah;  Location: [facility];  Service: Gynecology;  Laterality: Bilateral;   LAPAROSCOPIC OVARIAN CYSTECTOMY  03/27/2020   Procedure: LAPAROSCOPIC BILATERAL OVARIAN CYSTECTOMY;   Surgeon: Essie Hart, Leah;  Location: [facility];  Service: Gynecology;;   LAPAROSCOPIC TUBAL LIGATION Bilateral 07/07/2019   Procedure: LAPAROSCOPIC TUBAL LIGATION BY FULGURATION;  Surgeon: Hoover Browns, Leah;  Location: [facility];  Service: Gynecology;  Laterality: Bilateral;   PVC ABLATION N/A 06/30/2018   Procedure: PVC ABLATION;  Surgeon: Regan Lemming, Leah;  [facility];  Service: Cardiovascular;  Laterality: N/A;     Current Outpatient Medications  Medication Sig Dispense Refill   albuterol (PROVENTIL HFA) 108 (90 BASE) MCG/ACT inhaler Inhale 2 puffs into the lungs every 6 (six) hours as needed for wheezing. For shortness of breath or wheezing 1 Inhaler 2   ibuprofen (ADVIL) 800 MG tablet Take 1 tablet (800 mg total) by mouth every 8 (eight) hours as needed for moderate pain. 60 tablet 3   mexiletine (MEXITIL) 150 MG capsule Take 1 capsule (150 mg total) by mouth 2 (two) times daily. 180 capsule 3   Multiple Vitamins-Minerals (MULTIVITAMIN WITH MINERALS) tablet Take 1 tablet by mouth daily.     HYDROcodone-acetaminophen (NORCO/VICODIN) 5-325 MG tablet Take 1-2 tablets by mouth every 6 (six) hours as needed for moderate pain. (Patient not taking: Reported on 09/19/2020) 30 tablet 0   promethazine (PHENERGAN) 12.5 MG tablet Take 1 tablet (12.5 mg total) by mouth every 6 (six) hours as needed for nausea or vomiting. 30 tablet 0   No current facility-administered medications for this visit.    Allergies:   Patient has no known allergies.   Social  History:  The patient  reports that she has never smoked. She has never used smokeless tobacco. She reports current alcohol use. She reports that she does not use drugs.   Family History:  The patient's family history includes Cancer in her mother and paternal grandmother; Diabetes in her paternal grandmother; Heart attack (age of onset: 29) in her father; Heart attack (age of onset: 12) in her maternal grandmother;  Hypertension in her brother, maternal grandmother, and mother.   ROS:  Please see the history of present illness.   Otherwise, review of systems is positive for Gregory.   All other systems are reviewed and negative.   PHYSICAL EXAM: VS:  BP 114/62   Pulse 83   Ht 5\' 5"  (1.651 m)   Wt 228 lb (103.4 kg)   SpO2 98%   BMI 37.94 kg/m  , BMI Body mass index is 37.94 kg/m. GEN: Well nourished, well developed, in no acute distress  HEENT: normal  Neck: no JVD, carotid bruits, or masses Cardiac: RRR; no murmurs, rubs, or gallops,no edema  Respiratory:  clear to auscultation bilaterally, normal work of breathing GI: soft, nontender, nondistended, + BS MS: no deformity or atrophy  Skin: warm and dry Neuro:  Strength and sensation are intact Psych: euthymic mood, full affect  EKG:  EKG is ordered today. Personal review of the ekg ordered shows sinus rhythm, PVCs, rate 83  Recent Labs: 09/26/2019: Magnesium 1.7 03/27/2020: ALT 11; BUN 9; Creatinine, Ser 0.80; Hemoglobin 13.2; Platelets 301; Potassium 3.9; Sodium 135    Lipid Panel  No results found for: CHOL, TRIG, HDL, CHOLHDL, VLDL, LDLCALC, LDLDIRECT   Wt Readings from Last 3 Encounters:  09/19/20 228 lb (103.4 kg)  03/27/20 232 lb (105.2 kg)  02/06/20 228 lb 3.2 oz (103.5 kg)      Other studies Reviewed: Additional studies/ records that were reviewed today include: TTE 02/01/20  Review of the above records today demonstrates:  Normal LV systolic function with visual EF 60-65%. Left ventricle cavity  is normal in size. Normal global wall motion. Normal diastolic filling  pattern, normal LAP.  Mild (Grade I) mitral regurgitation.  Mild tricuspid regurgitation.  Insignificant pericardial effusion.  Normal sinus rhythm with frequent premature ventricular contractions.  Compared to prior study dated 01/18/2019: LVEF improved from 50-55% to  60-65%. No other significant changes noted.    ASSESSMENT AND PLAN:  1.  PVCs:  Currently on mexiletine.  High risk medication monitoring.  Ejection fraction is fortunately improved to normal.  Is continued to have PVCs on her ECG.  Despite that, she feels well.  Her most recent echo shows a normal ejection fraction.  She is overall happy.  Management.  As she is feeling well, acceptable continue to have PVCs.  If her EF goes down again, she Alaura Schippers likely need further therapy.  Current medicines are reviewed at length with the patient today.   The patient does not have concerns regarding her medicines.  The following changes were made today: Gregory  Labs/ tests ordered today include:  Orders Placed This Encounter  Procedures   EKG 12-Lead      Disposition:   FU with Winnie Umali 6 months  Signed, Miyanna Wiersma 03/18/2019, Leah  09/19/2020 8:08 AM     [facility] [address] [place] [phone] (office) [phone] (fax)

## 2020-09-19 NOTE — Patient Instructions (Signed)
Medication Instructions:  Your physician recommends that you continue on your current medications as directed. Please refer to the Current Medication list given to you today.  *If you need a refill on your cardiac medications before your next appointment, please call your pharmacy*   Lab Work: None ordered   Testing/Procedures: None ordered   Follow-Up: At [facility], you and your health needs are our priority.  As part of our continuing mission to provide you with exceptional heart care, we have created designated Provider Care Teams.  These Care Teams include your primary Cardiologist (physician) and Advanced Practice Providers (APPs -  Physician Assistants and Nurse Practitioners) who all work together to provide you with the care you need, when you need it.  Your next appointment:   6 month(s)  The format for your next appointment:   In Person  Provider:   You will see one of the following Advanced Practice Providers on your designated Care Team:   Renee Ursuy, PA-C Michael "Andy" Tillery, PA-C   Thank you for choosing [facility]!!   Wylee Ogden, RN [phone]         

## 2020-09-30 IMAGING — CT CT ANGIO CHEST
2 of 6 series · 19 of 36 positions shown · IV contrast (omnipaque)
Comparison: Chest x-ray 06/07/2014

CLINICAL DATA: History of COVID chest pain radiating to the back

EXAM:
CT ANGIOGRAPHY CHEST WITH CONTRAST
TECHNIQUE: Multidetector CT imaging of the chest was performed using the
standard protocol during bolus administration of intravenous
contrast. Multiplanar CT image reconstructions and MIPs were
obtained to evaluate the vascular anatomy.
CONTRAST:  75mL OMNIPAQUE IOHEXOL 350 MG/ML SOLN

[Series 7: pe thins · axial · 0.76mm/px · z∈[+1013,+1236]mm · 18 of 355 slices shown]
[im 18/355  lung]
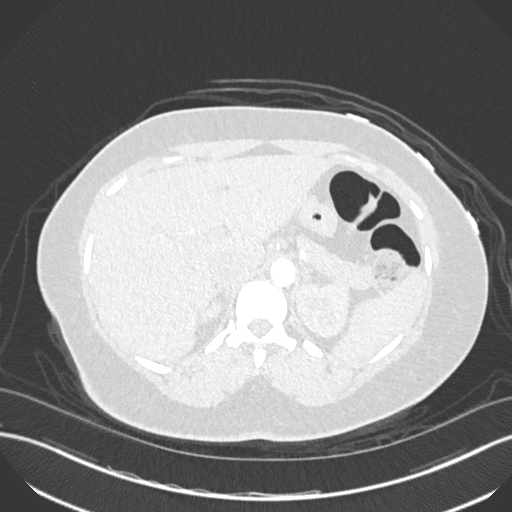
[im 36/355  mediastinal]
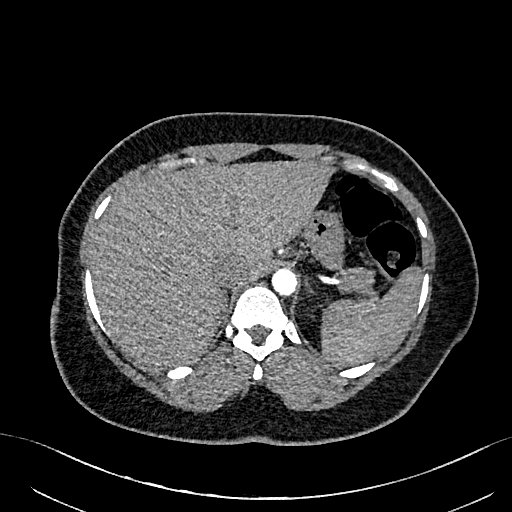
[im 54/355  lung]
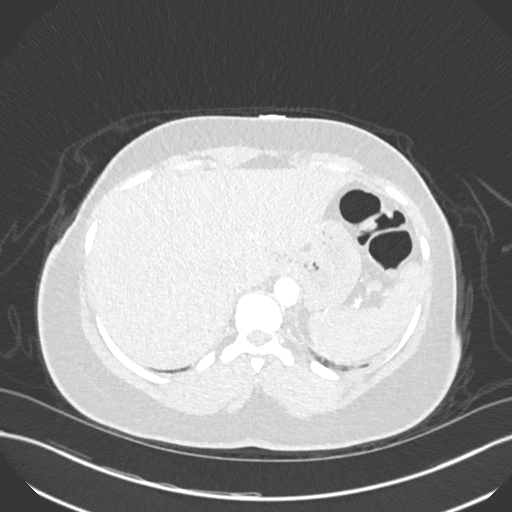
[im 71/355  mediastinal]
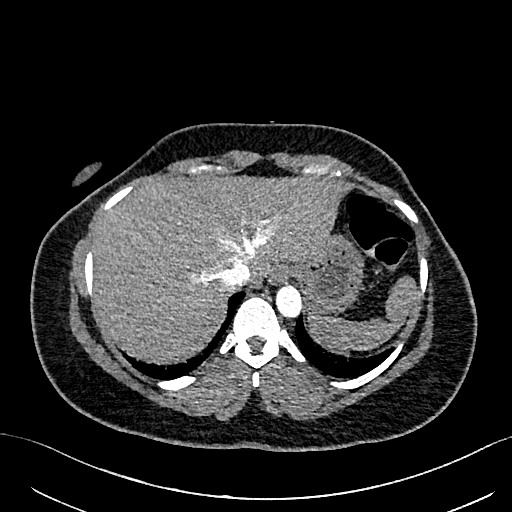
[im 89/355  lung]
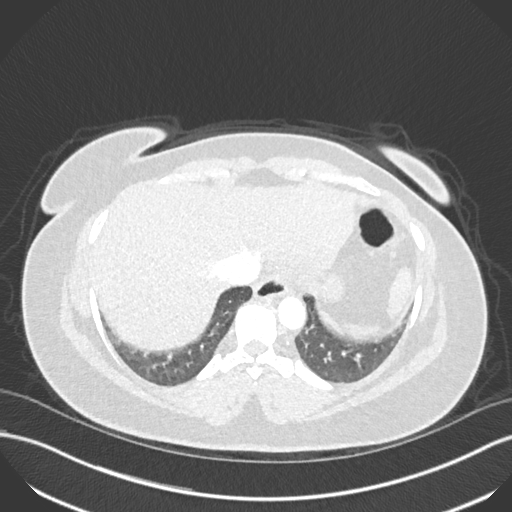
[im 107/355  mediastinal]
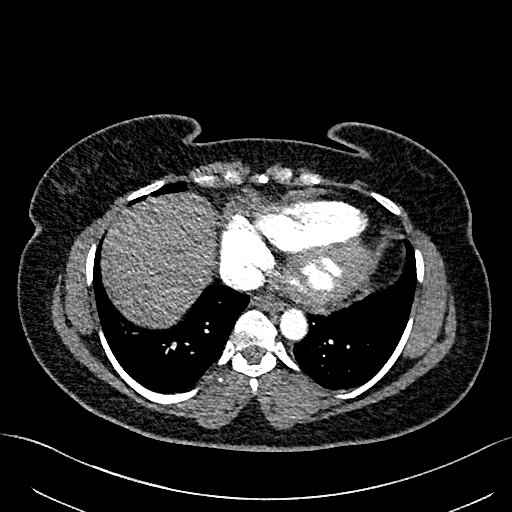
[im 124/355  lung]
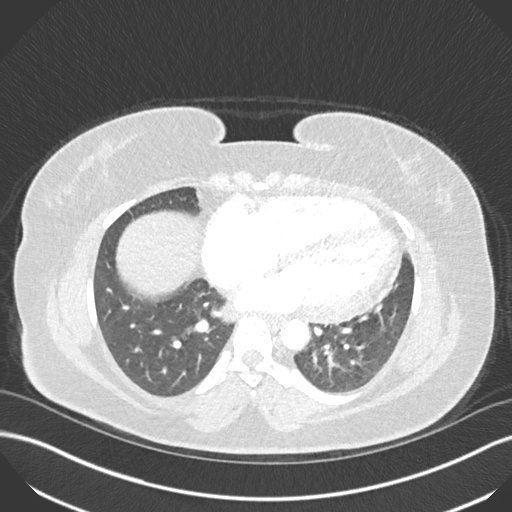
[im 142/355  mediastinal]
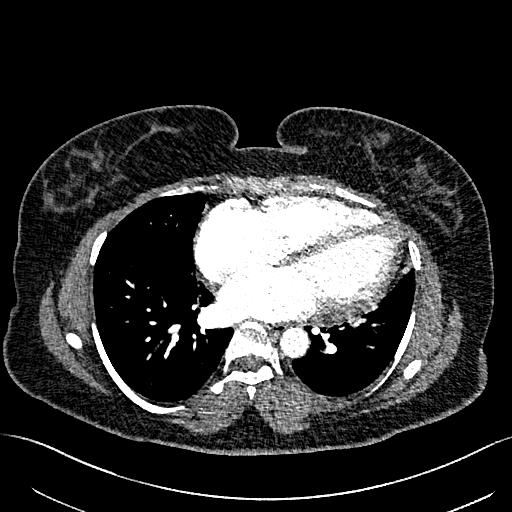
[im 160/355  lung]
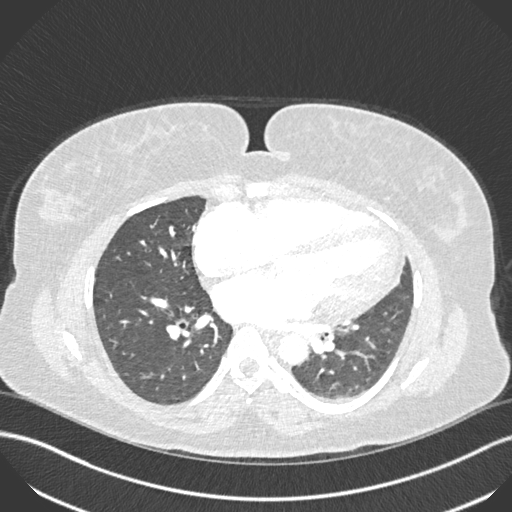
[im 195/355  mediastinal]
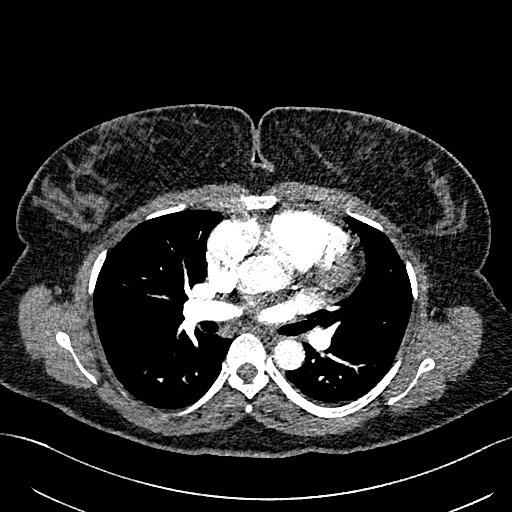
[im 213/355  lung]
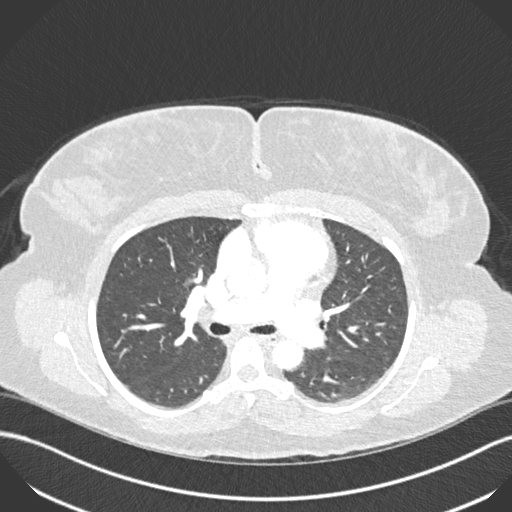
[im 231/355  mediastinal]
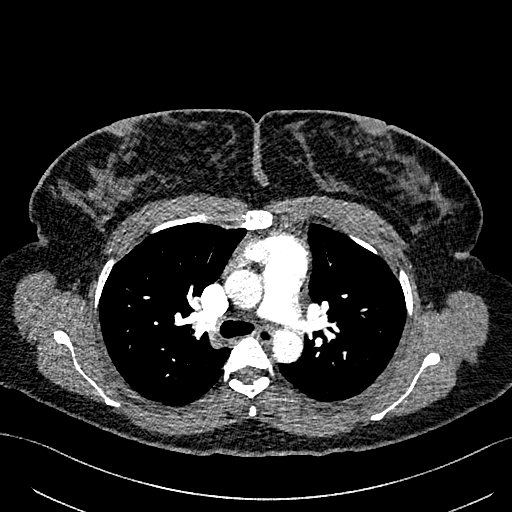
[im 248/355  lung]
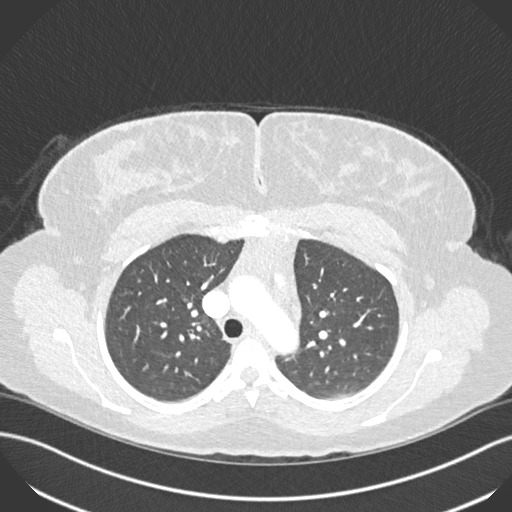
[im 266/355  mediastinal]
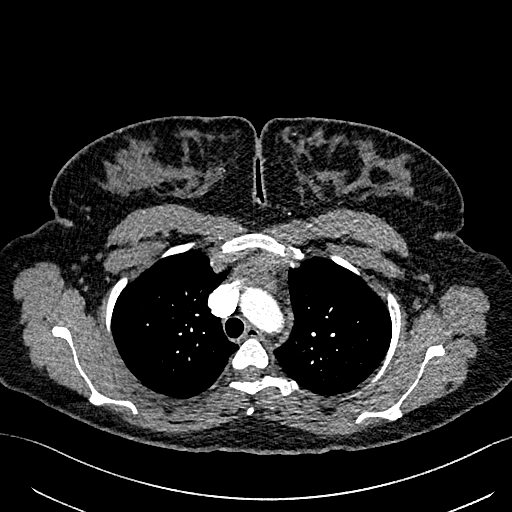
[im 284/355  lung]
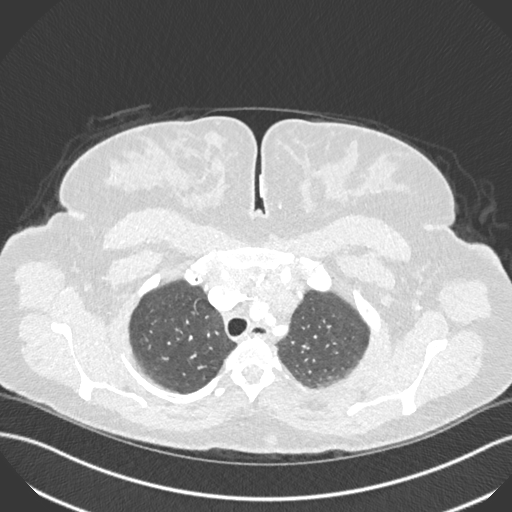
[im 301/355  mediastinal]
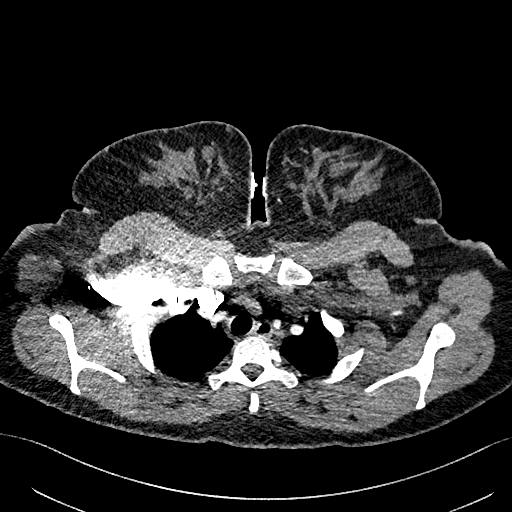
[im 319/355  lung]
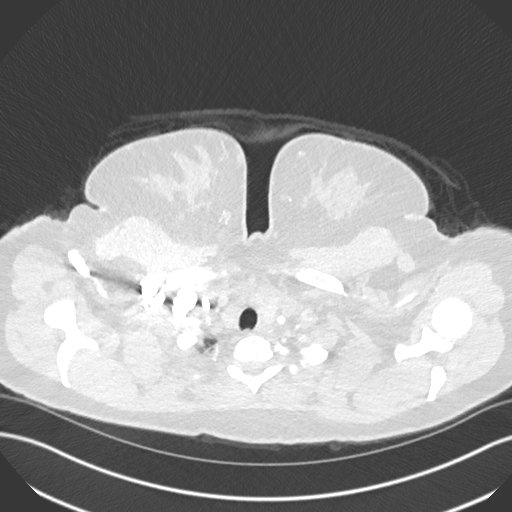
[im 337/355  mediastinal]
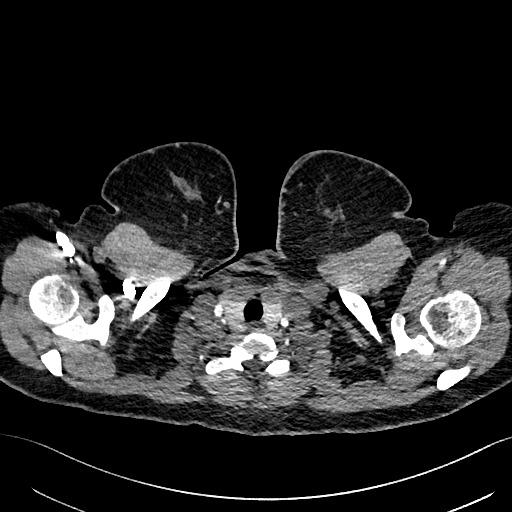

[Series 8: pe 2mm cor · coronal · 0.48mm/px · 1 of 145 slices shown]
[im 73/145  mediastinal]
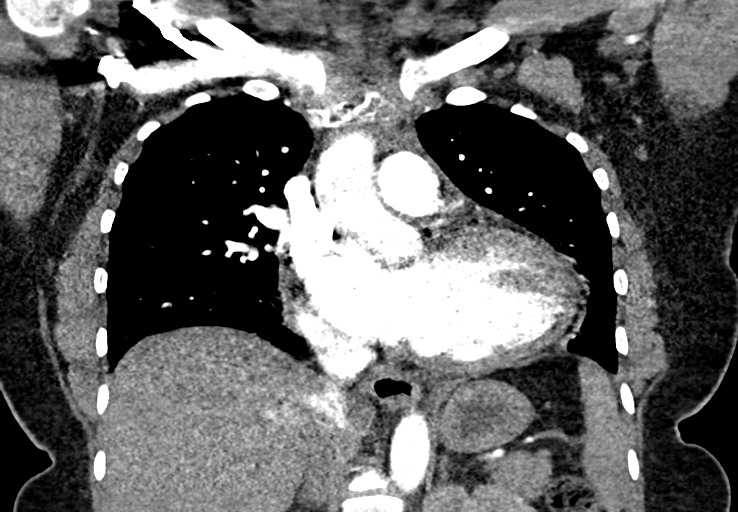

[19 of 36 positions shown; findings below may reference images not displayed]

FINDINGS: Cardiovascular: Satisfactory opacification of the pulmonary arteries
to the segmental level. No evidence of pulmonary embolism. The heart
appears slightly enlarged. No pericardial effusion. Nonaneurysmal
aorta.

Mediastinum/Nodes: Midline trachea. No suspicious adenopathy. Soft
tissue density in the anterior mediastinum likely reflects thymic
tissue. Esophagus unremarkable

Lungs/Pleura: Lungs are clear. No pleural effusion or pneumothorax.

Upper Abdomen: No acute abnormality.

Musculoskeletal: No chest wall abnormality. No acute or significant
osseous findings.

Review of the MIP images confirms the above findings.
IMPRESSION: Negative for acute pulmonary embolus or aortic dissection. Clear
lung fields. Cardiomegaly.

## 2021-03-13 ENCOUNTER — Ambulatory Visit: Payer: BC Managed Care – PPO | Admitting: Student

## 2021-03-13 NOTE — Progress Notes (Signed)
? ?PCP:  [name] Side M, PA-C ?Primary Cardiologist: None ?Electrophysiologist: Will [name], MD  ? ?[name] is a 41 y.o. female seen today for Will [name], MD for routine electrophysiology followup.  Since last being seen in our clinic the patient reports doing very well. She occasionally feels her palpitations, especially when very stressed. Denies lightheadedness or dizziness. No SOB or CP. She sometimes feels an especially hard PVC.  ? ?Past Medical History:  ?Diagnosis Date  ? Asthma   ? Cardiomyopathy (HCC)   ? induced by pvc's improved per 01-18-2019 echo [facility] 02-03-2019  ? Chlamydia   ? ?Past Surgical History:  ?Procedure Laterality Date  ? Cyst removal rt ear  yrs ago  ? LAPAROSCOPIC BILATERAL SALPINGECTOMY Bilateral 03/27/2020  ? Procedure: LAPAROSCOPIC BILATERAL SALPINGECTOMY, EVACUATION OF HEMOPERITONEUM;  Surgeon: Essie Hart, MD;  Location: [facility];  Service: Gynecology;  Laterality: Bilateral;  ? LAPAROSCOPIC OVARIAN CYSTECTOMY  03/27/2020  ? Procedure: LAPAROSCOPIC BILATERAL OVARIAN CYSTECTOMY;  Surgeon: Essie Hart, MD;  Location: [facility];  Service: Gynecology;;  ? LAPAROSCOPIC TUBAL LIGATION Bilateral 07/07/2019  ? Procedure: LAPAROSCOPIC TUBAL LIGATION BY FULGURATION;  Surgeon: Hoover Browns, MD;  Location: [facility];  Service: Gynecology;  Laterality: Bilateral;  ? PVC ABLATION N/A 06/30/2018  ? Procedure: PVC ABLATION;  Surgeon: Regan Lemming, MD;  Location: [facility];  Service: Cardiovascular;  Laterality: N/A;  ? ? ?Current Outpatient Medications  ?Medication Sig Dispense Refill  ? albuterol (PROVENTIL HFA) 108 (90 BASE) MCG/ACT inhaler Inhale 2 puffs into the lungs every 6 (six) hours as needed for wheezing. For shortness of breath or wheezing 1 Inhaler 2  ? EPINEPHrine 0.3 mg/0.3 mL IJ SOAJ injection as directed.    ? ibuprofen (ADVIL) 800 MG tablet Take 1 tablet (800 mg total) by mouth every 8 (eight) hours as needed for  moderate pain. 60 tablet 3  ? mexiletine (MEXITIL) 150 MG capsule Take 1 capsule (150 mg total) by mouth 2 (two) times daily. 180 capsule 3  ? Multiple Vitamins-Minerals (MULTIVITAMIN WITH MINERALS) tablet Take 1 tablet by mouth daily.    ? ?No current facility-administered medications for this visit.  ? ? ?Allergies  ?Allergen Reactions  ? Peanut (Diagnostic) Anaphylaxis  ? Shellfish-Derived Products Anaphylaxis  ? ? ?Social History  ? ?Socioeconomic History  ? Marital status: Married  ?  Spouse name: Not on file  ? Number of children: 2  ? Years of education: Not on file  ? Highest education level: Not on file  ?Occupational History  ? Not on file  ?Tobacco Use  ? Smoking status: Never  ?  Passive exposure: Never  ? Smokeless tobacco: Never  ?Vaping Use  ? Vaping Use: Never used  ?Substance and Sexual Activity  ? Alcohol use: Yes  ?  Comment: occ  ? Drug use: No  ? Sexual activity: Yes  ?  Birth control/protection: Surgical  ?  Comment: camilla  ?Other Topics Concern  ? Not on file  ?Social History Narrative  ? Not on file  ? ?Social Determinants of Health  ? ?Financial Resource Strain: Not on file  ?Food Insecurity: Not on file  ?Transportation Needs: Not on file  ?Physical Activity: Not on file  ?Stress: Not on file  ?Social Connections: Not on file  ?Intimate Partner Violence: Not on file  ? ? ? ?Review of Systems: ?All other systems reviewed and are otherwise negative except as noted above. ? ?  Physical Exam: ?Vitals:  ? 03/14/21 0755  ?BP: 108/66  ?Pulse: 74  ?SpO2: 99%  ?Weight: 224 lb 3.2 oz (101.7 kg)  ?Height: 5\' 6"  (1.676 m)  ? ? ?GEN- The patient is well appearing, alert and oriented x 3 today.   ?HEENT: normocephalic, atraumatic; sclera clear, conjunctiva pink; hearing intact; oropharynx clear; neck supple, no JVP ?Lymph- no cervical lymphadenopathy ?Lungs- Clear to ausculation bilaterally, normal work of breathing.  No wheezes, rales, rhonchi ?Heart- Regular rate and rhythm, no murmurs, rubs or  gallops, PMI not laterally displaced ?GI- soft, non-tender, non-distended, bowel sounds present, no hepatosplenomegaly ?Extremities- no clubbing, cyanosis, or edema; DP/PT/radial pulses 2+ bilaterally ?MS- no significant deformity or atrophy ?Skin- warm and dry, no rash or lesion ?Psych- euthymic mood, full affect ?Neuro- strength and sensation are intact ? ?EKG is ordered. Personal review of EKG from today shows NSR at 74 bpm with PVCs in a pattern of trigeminy. ? ?Additional studies reviewed include: ?Previous EP and gen cards notes.  ? ?Assessment and Plan: ? ?1. Symptomatic PVCs ?Unsuccessful ablation attempt 06/2018 ?Echo 01/2020 showed Normal EF despite continued frequent PVCs ?Continue mexitil 150 mg BID ?Update Echo. If EF down, will need to consider alternate medication or referral to tertiary center for ablation consideration . ? ?2. H/o chest pain ?This improved and it appears she declined stress testing.  ?Revisit if recurs ? ?Follow up with Dr. 02/2020 in 6 months; Sooner pending Echo.   ? ?Elberta Fortis, PA-C  ?03/14/21 ?7:58 AM  ?

## 2021-03-14 ENCOUNTER — Ambulatory Visit: Payer: BC Managed Care – PPO | Admitting: Student

## 2021-03-14 ENCOUNTER — Encounter: Payer: Self-pay | Admitting: Student

## 2021-03-14 ENCOUNTER — Other Ambulatory Visit: Payer: Self-pay

## 2021-03-14 VITALS — BP 108/66 | HR 74 | Ht 66.0 in | Wt 224.2 lb

## 2021-03-14 DIAGNOSIS — I493 Ventricular premature depolarization: Secondary | ICD-10-CM | POA: Diagnosis not present

## 2021-03-14 DIAGNOSIS — R072 Precordial pain: Secondary | ICD-10-CM | POA: Diagnosis not present

## 2021-03-14 MED ORDER — MEXILETINE HCL 150 MG PO CAPS: 150.0000 mg | ORAL_CAPSULE | Freq: Two times a day (BID) | ORAL | 3 refills | Status: DC

## 2021-03-14 NOTE — Progress Notes (Signed)
work

## 2021-03-14 NOTE — Patient Instructions (Signed)
Medication Instructions:  ?Your physician recommends that you continue on your current medications as directed. Please refer to the Current Medication list given to you today. ? ?*If you need a refill on your cardiac medications before your next appointment, please call your pharmacy* ? ? ?Lab Work: ?None  ?If you have labs (blood work) drawn today and your tests are completely normal, you will receive your results only by: ?MyChart Message (if you have MyChart) OR ?A paper copy in the mail ?If you have any lab test that is abnormal or we need to change your treatment, we will call you to review the results. ? ? ?Testing/Procedures: ?Your physician has requested that you have an echocardiogram. Echocardiography is a painless test that uses sound waves to create images of your heart. It provides your doctor with information about the size and shape of your heart and how well your heart?s chambers and valves are working. This procedure takes approximately one hour. There are no restrictions for this procedure. ? ? ?Follow-Up: ?At [facility], you and your health needs are our priority.  As part of our continuing mission to provide you with exceptional heart care, we have created designated Provider Care Teams.  These Care Teams include your primary Cardiologist (physician) and Advanced Practice Providers (APPs -  Physician Assistants and Nurse Practitioners) who all work together to provide you with the care you need, when you need it. ? ? ?Your next appointment:   ?6 month(s) ? ?The format for your next appointment:   ?In Person ? ?Provider:   ?Loman Brooklyn, MD{ ?

## 2021-03-24 ENCOUNTER — Other Ambulatory Visit: Payer: Self-pay

## 2021-03-24 ENCOUNTER — Ambulatory Visit (HOSPITAL_COMMUNITY): Payer: BC Managed Care – PPO | Attending: Internal Medicine

## 2021-03-24 DIAGNOSIS — I493 Ventricular premature depolarization: Secondary | ICD-10-CM | POA: Insufficient documentation

## 2021-03-24 DIAGNOSIS — R072 Precordial pain: Secondary | ICD-10-CM | POA: Insufficient documentation

## 2021-03-24 LAB — ECHOCARDIOGRAM COMPLETE
Area-P 1/2: 2.54 cm2
S' Lateral: 3.7 cm

## 2021-04-01 IMAGING — US US OB TRANSVAGINAL
1 series · 13 of 28 positions shown · non-contrast
Comparison: None.

CLINICAL DATA: Pelvic pain.  Positive pregnancy test

EXAM:
OBSTETRIC <14 WK US AND TRANSVAGINAL OB US
TECHNIQUE: Both transabdominal and transvaginal ultrasound examinations were
performed for complete evaluation of the gestation as well as the
maternal uterus, adnexal regions, and pelvic cul-de-sac.
Transvaginal technique was performed to assess early pregnancy.

[Series 1: us ob transvaginal · 107 acquisitions, 13 frames shown]
[im 4/107]
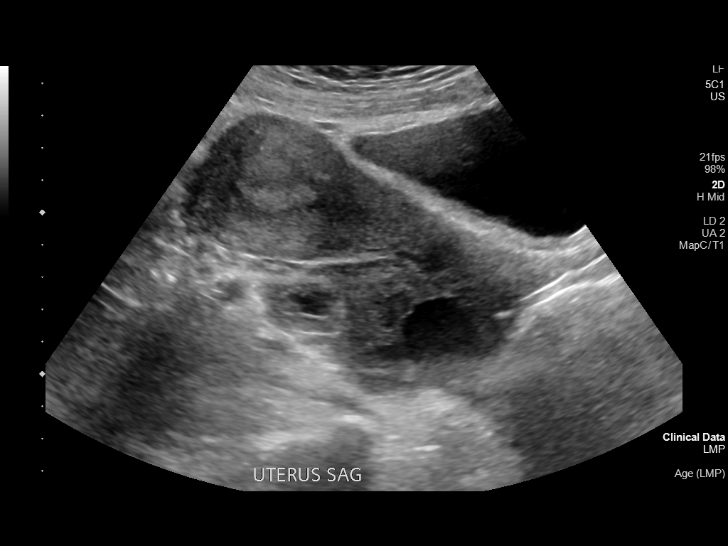
[im 12/107]
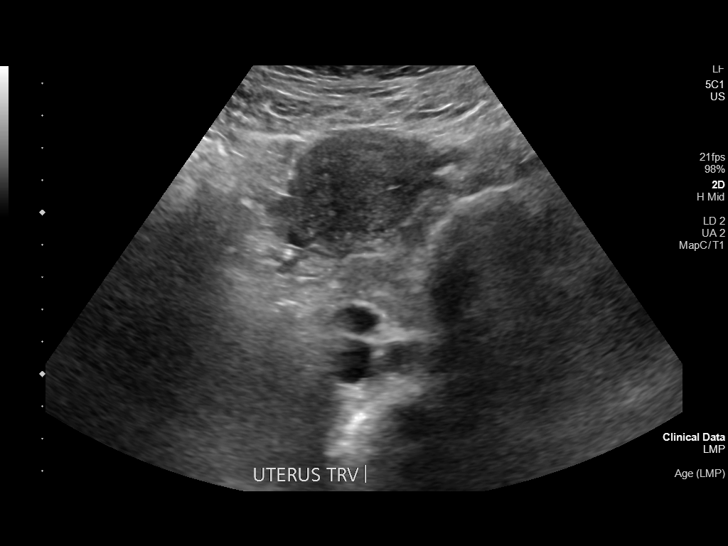
[im 20/107]
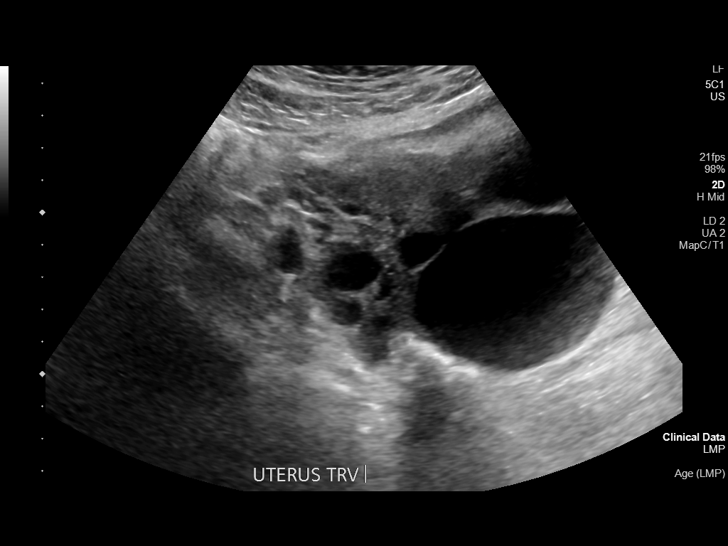
[im 28/107]
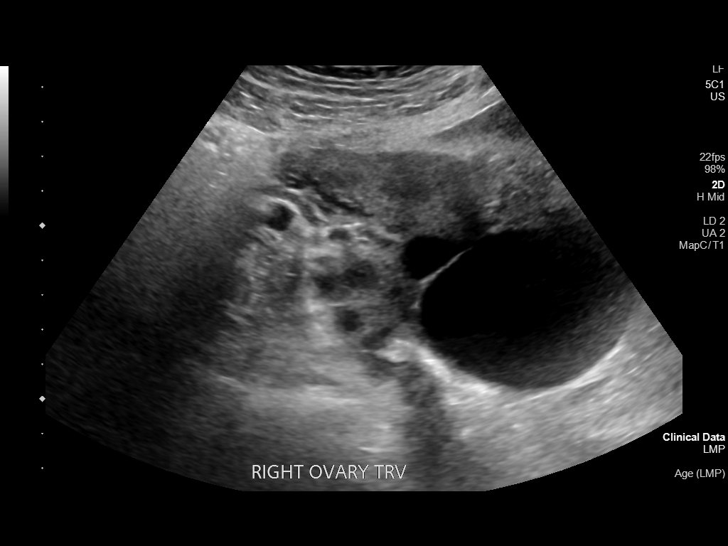
[im 36/107]
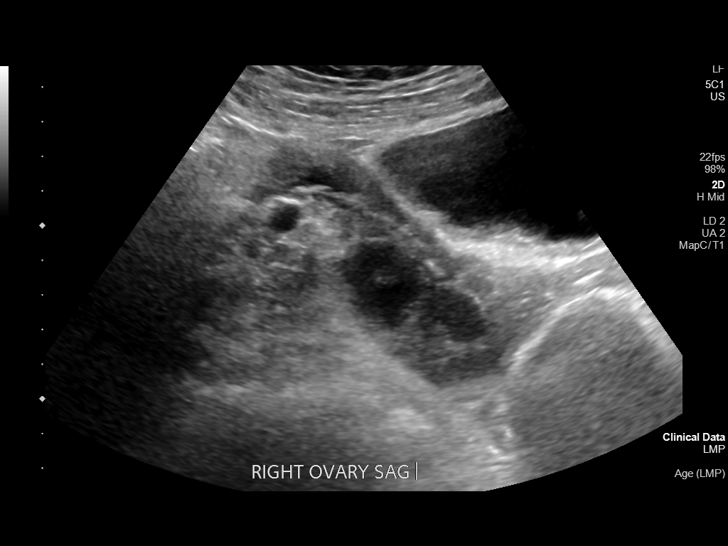
[im 44/107]
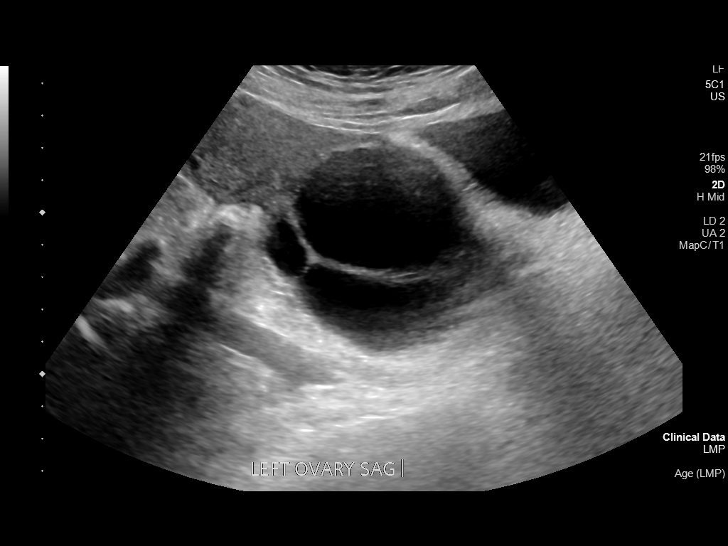
[im 55/107]
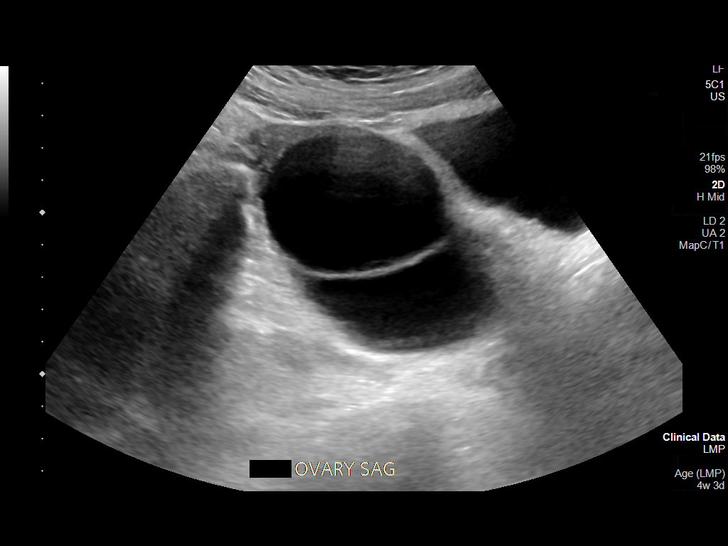
[im 63/107]
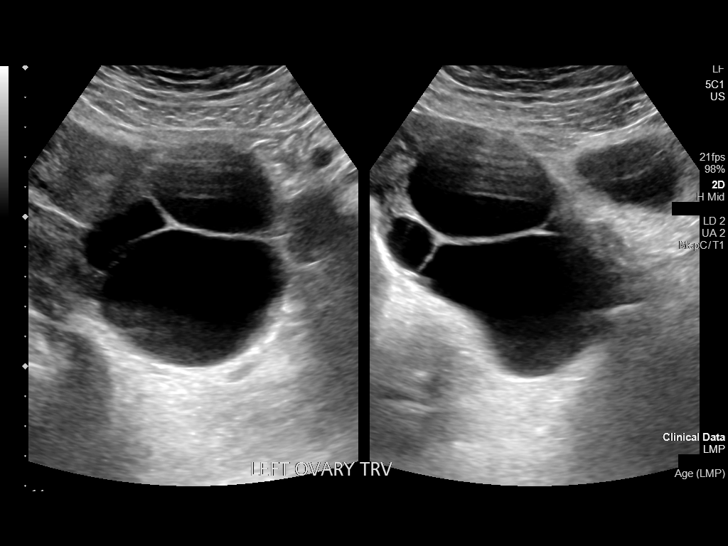
[im 71/107]
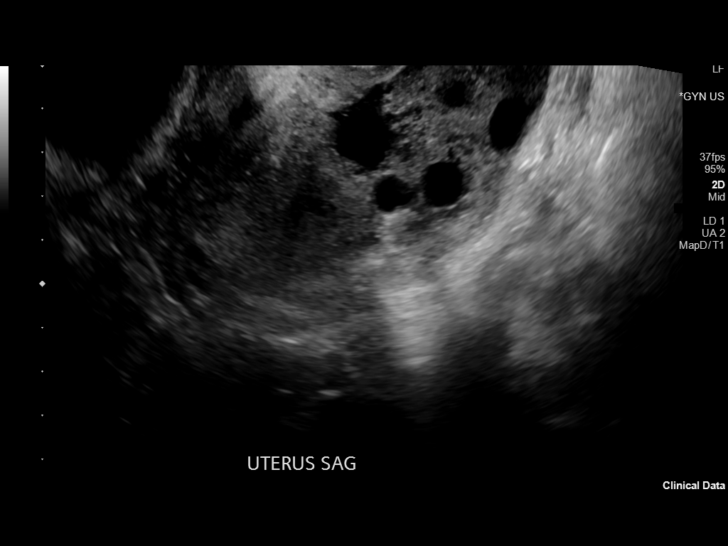
[im 79/107]
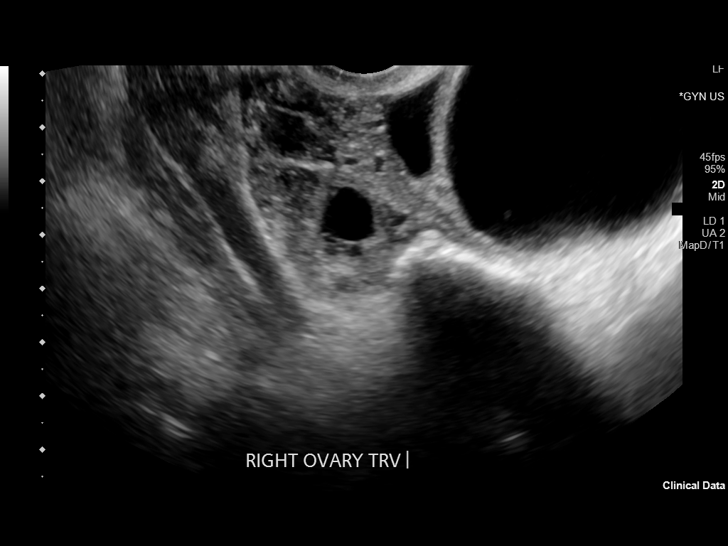
[im 87/107]
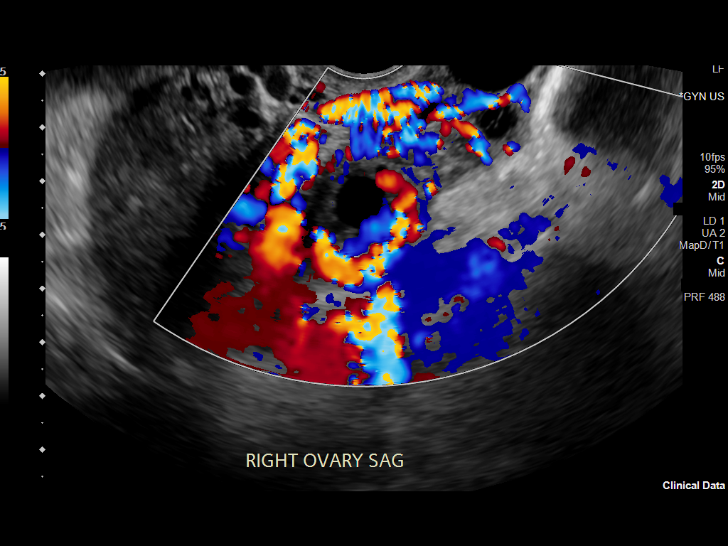
[im 95/107]
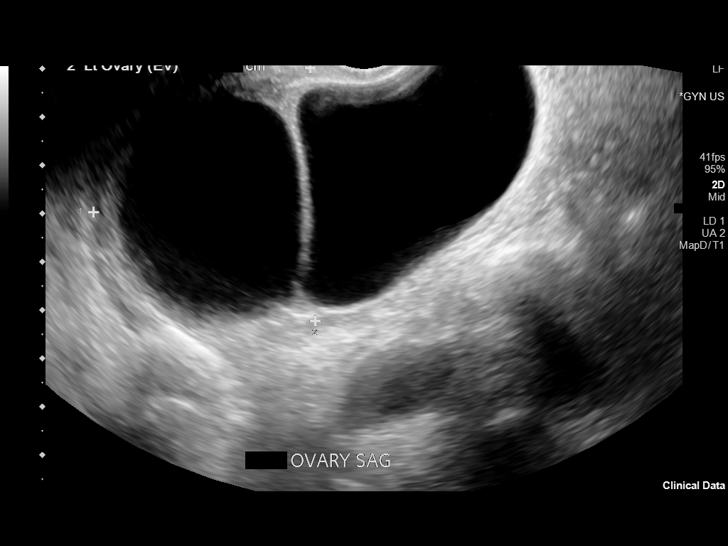
[im 103/107]
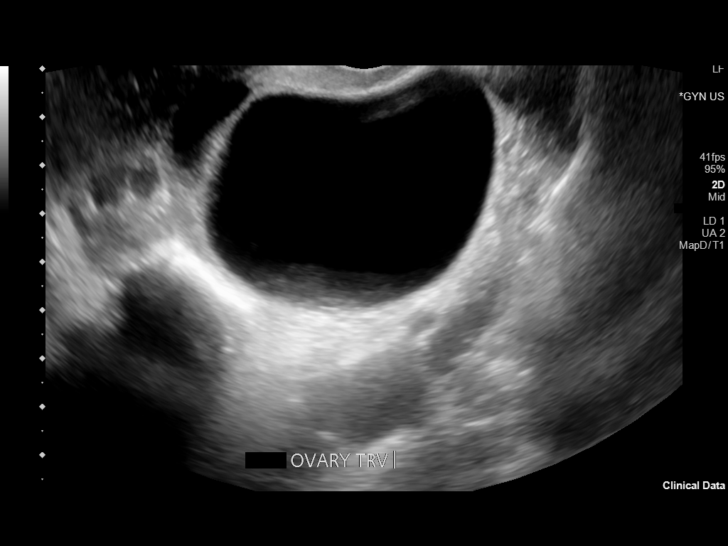

[13 of 28 positions shown; findings below may reference images not displayed]

FINDINGS: Intrauterine gestational sac: None

Yolk sac:  Not Visualized.

Embryo:  Not Visualized.

Cardiac Activity: Not Visualized.

Heart Rate:   bpm

MSD:   mm    w     d

CRL:    mm    w    d                  US EDC:

Subchorionic hemorrhage:  None visualized.

Maternal uterus/adnexae: Endometrium 13 mm in thickness. Small
amount of fluid within the endometrial canal. Multiple follicles in
the right ovary. Multiple cysts within the left ovary, the largest
measuring up to 5.7 cm. No free fluid. No suspicious adnexal mass.
IMPRESSION: No intrauterine pregnancy visualized. Differential considerations
would include early intrauterine pregnancy too early to visualize,
spontaneous abortion, or occult ectopic pregnancy. Recommend close
clinical followup and serial quantitative beta HCGs and ultrasounds.

Multiple large left ovarian cysts measuring up to 5.7 cm.

## 2021-04-01 IMAGING — US US OB COMP LESS 14 WK
1 series · 13 of 28 positions shown · non-contrast
Comparison: None.

CLINICAL DATA: Pelvic pain.  Positive pregnancy test

EXAM:
OBSTETRIC <14 WK US AND TRANSVAGINAL OB US
TECHNIQUE: Both transabdominal and transvaginal ultrasound examinations were
performed for complete evaluation of the gestation as well as the
maternal uterus, adnexal regions, and pelvic cul-de-sac.
Transvaginal technique was performed to assess early pregnancy.

[Series 1: us ob comp less 14 wk · 107 acquisitions, 13 frames shown]
[im 4/107]
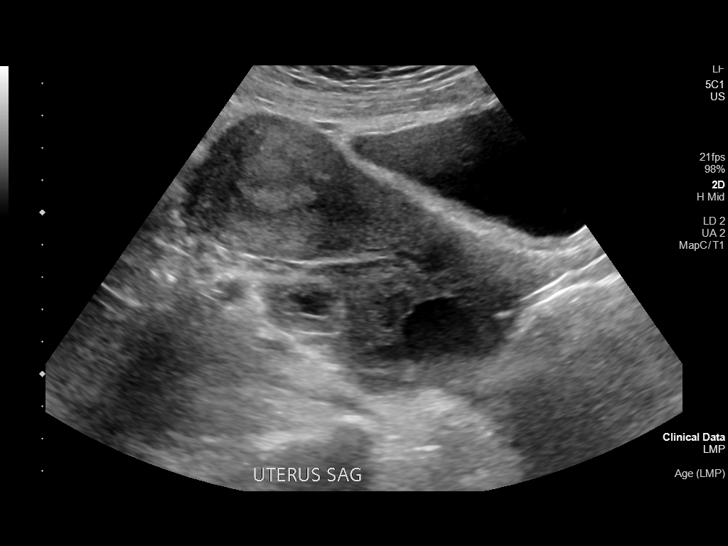
[im 12/107]
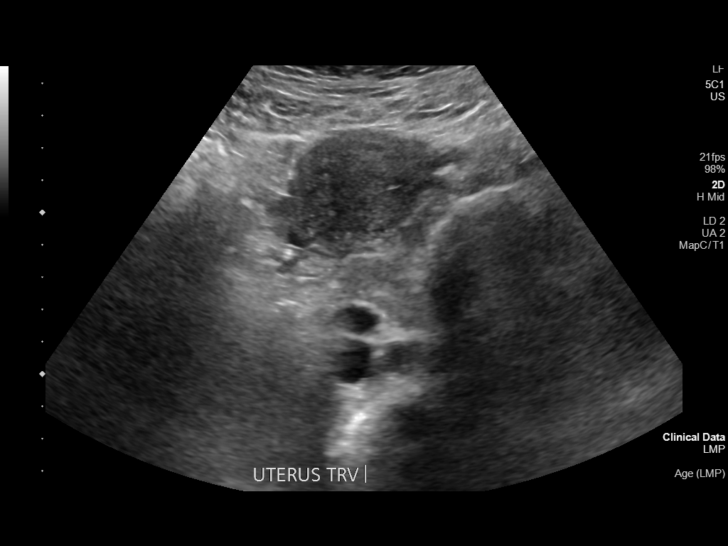
[im 20/107]
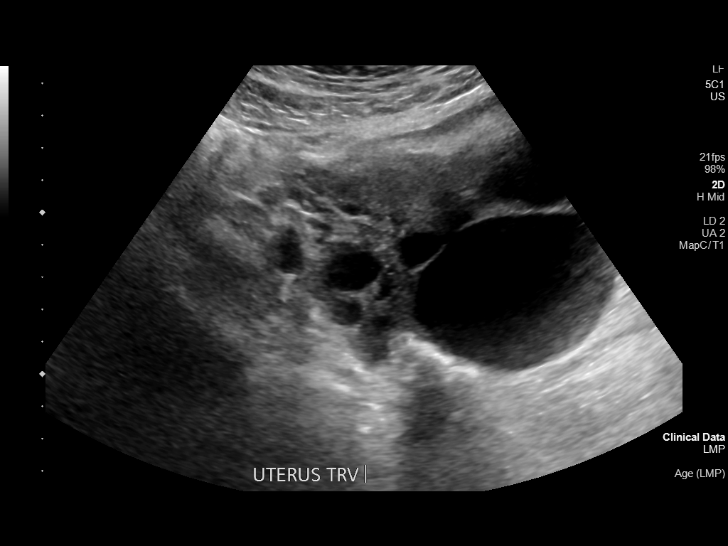
[im 28/107]
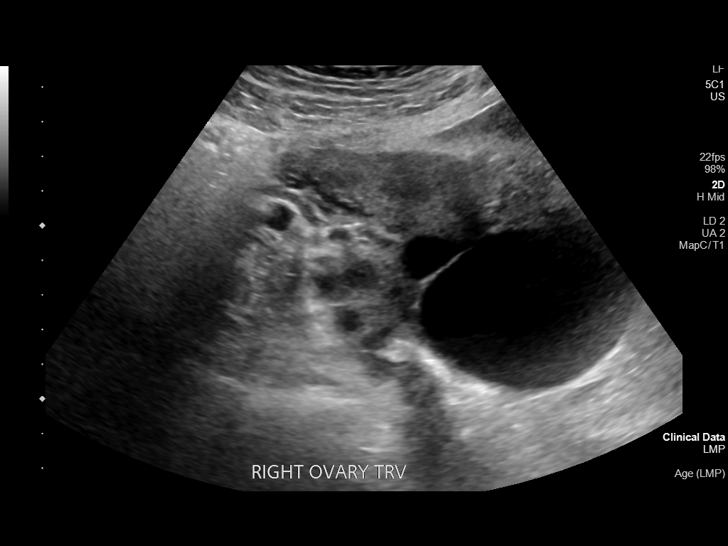
[im 36/107]
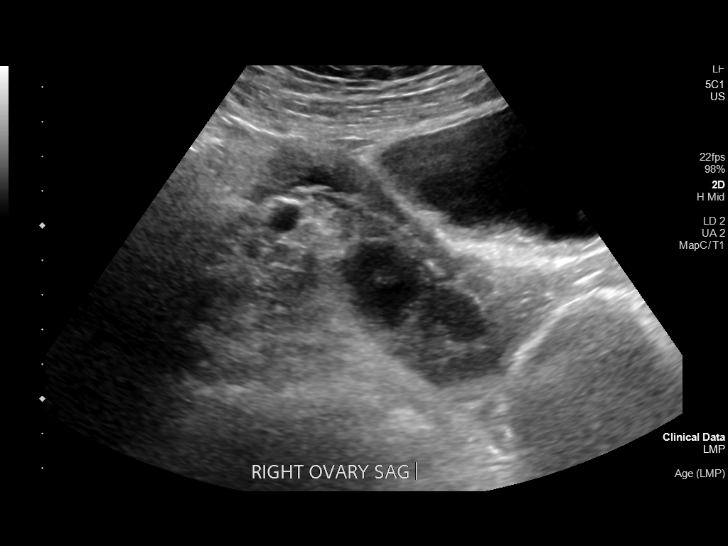
[im 44/107]
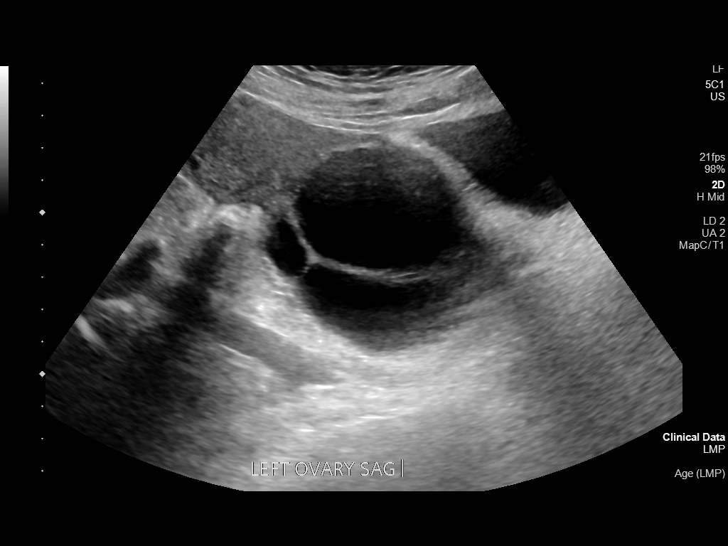
[im 55/107]
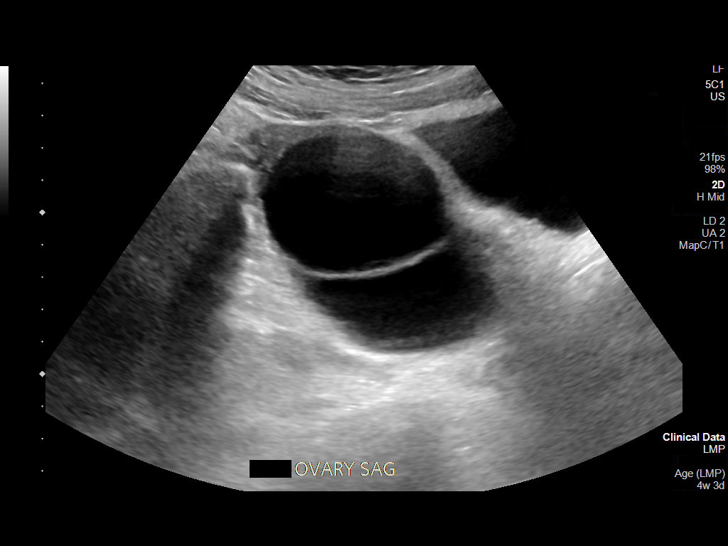
[im 63/107]
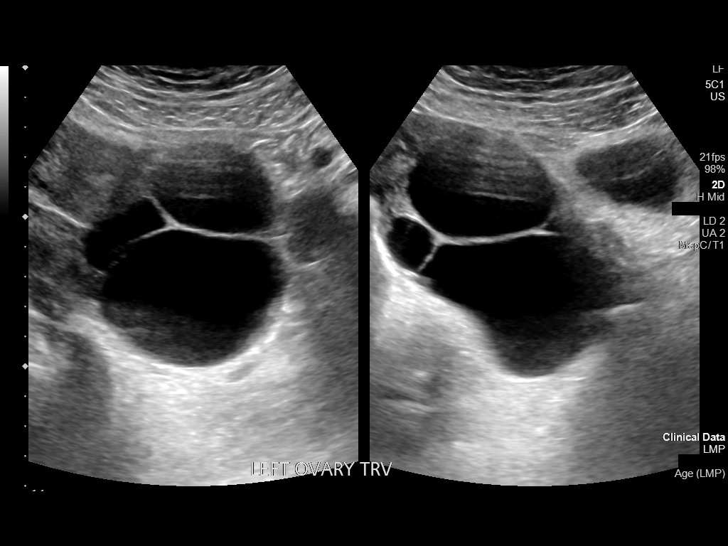
[im 71/107]
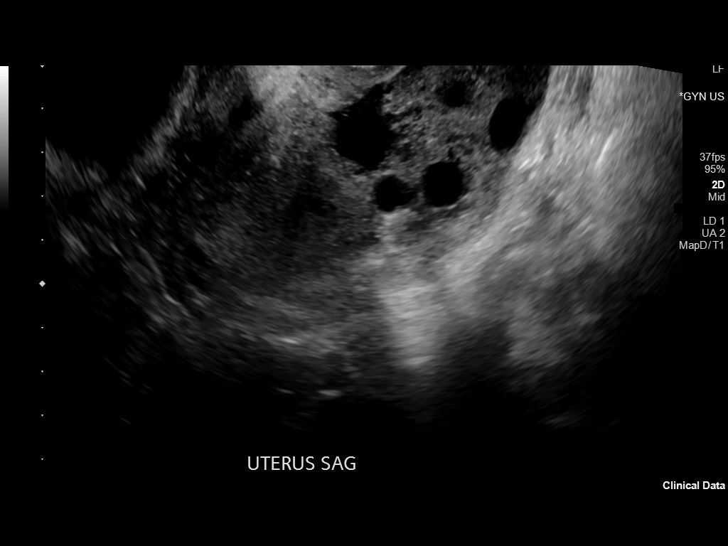
[im 79/107]
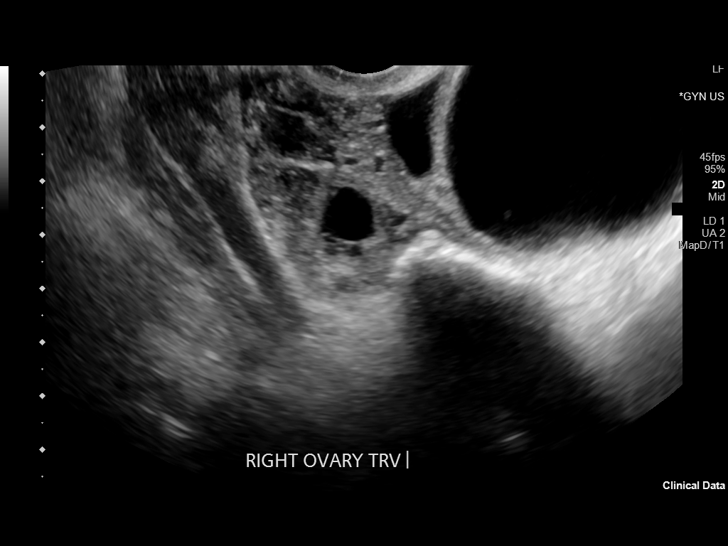
[im 87/107]
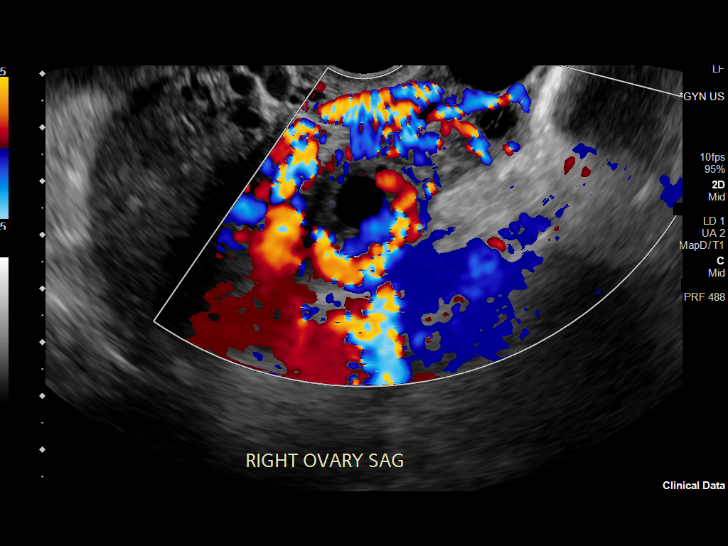
[im 95/107]
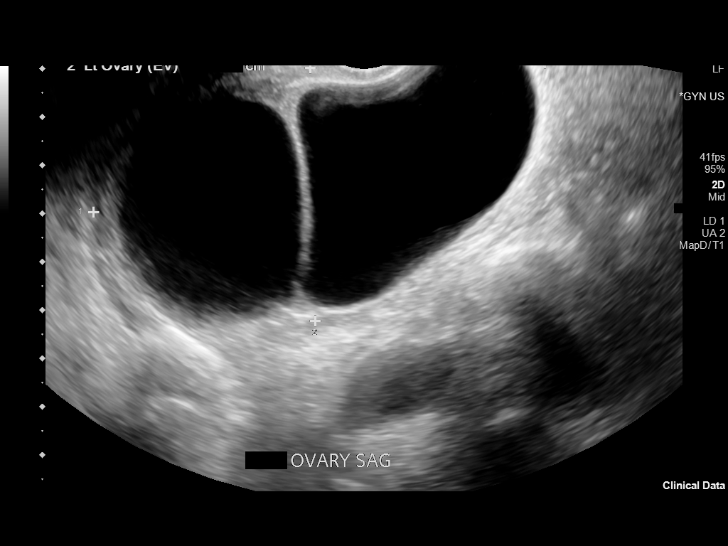
[im 103/107]
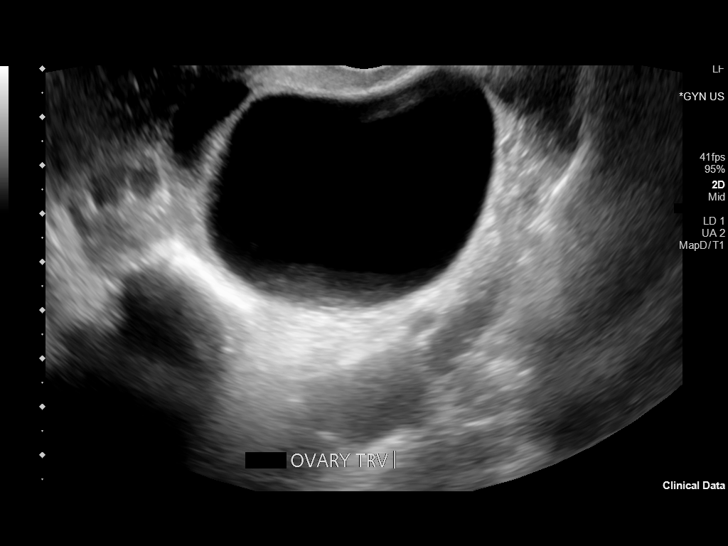

[13 of 28 positions shown; findings below may reference images not displayed]

FINDINGS: Intrauterine gestational sac: None

Yolk sac:  Not Visualized.

Embryo:  Not Visualized.

Cardiac Activity: Not Visualized.

Heart Rate:   bpm

MSD:   mm    w     d

CRL:    mm    w    d                  US EDC:

Subchorionic hemorrhage:  None visualized.

Maternal uterus/adnexae: Endometrium 13 mm in thickness. Small
amount of fluid within the endometrial canal. Multiple follicles in
the right ovary. Multiple cysts within the left ovary, the largest
measuring up to 5.7 cm. No free fluid. No suspicious adnexal mass.
IMPRESSION: No intrauterine pregnancy visualized. Differential considerations
would include early intrauterine pregnancy too early to visualize,
spontaneous abortion, or occult ectopic pregnancy. Recommend close
clinical followup and serial quantitative beta HCGs and ultrasounds.

Multiple large left ovarian cysts measuring up to 5.7 cm.

## 2021-04-02 IMAGING — US US OB TRANSVAGINAL
1 series · 15 of 28 positions shown · non-contrast
Comparison: Yesterday

CLINICAL DATA: Tubal ligation with pain and positive pregnancy
test, presumed ectopic per notes

EXAM:
TRANSVAGINAL OB ULTRASOUND
TECHNIQUE: Transvaginal ultrasound was performed for complete evaluation of the
gestation as well as the maternal uterus, adnexal regions, and
pelvic cul-de-sac.

[Series 1: us ob transvaginal · 40 acquisitions, 15 frames shown]
[im 1/40]
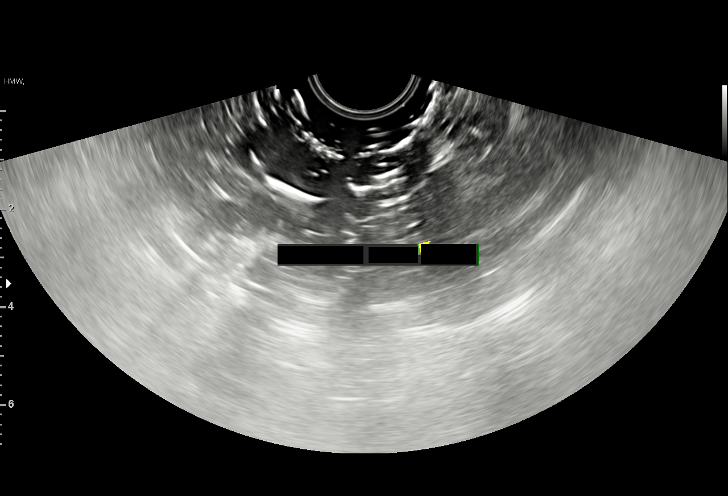
[im 3/40]
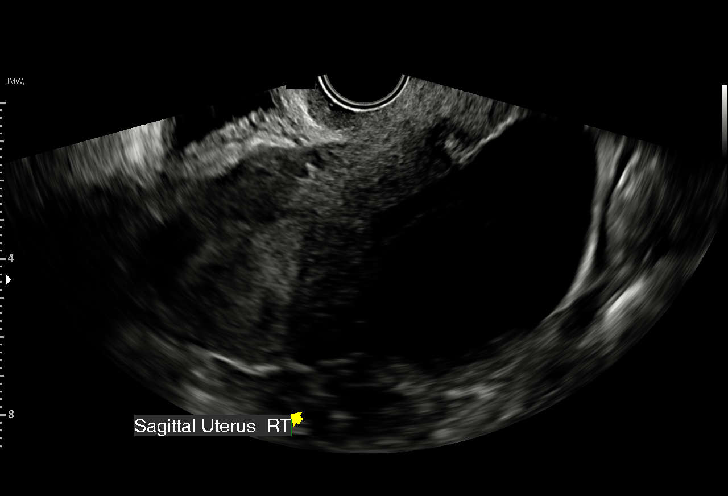
[im 6/40]
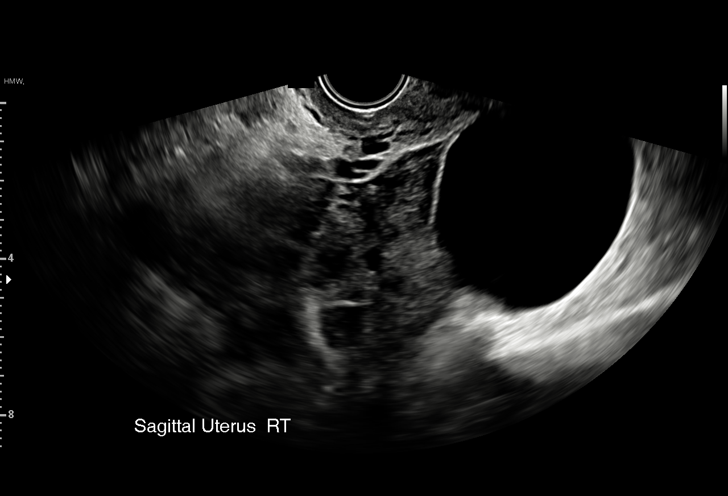
[im 9/40]
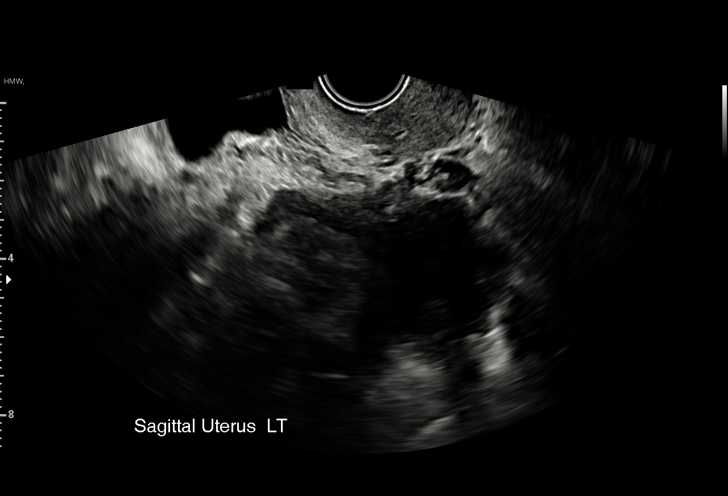
[im 12/40]
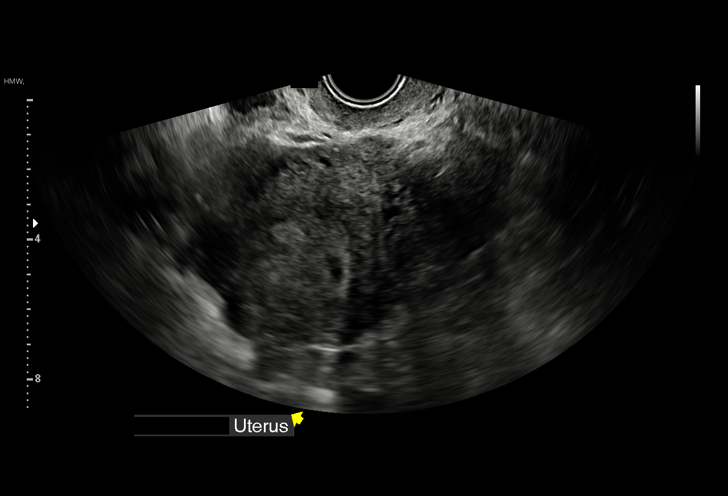
[im 15/40]
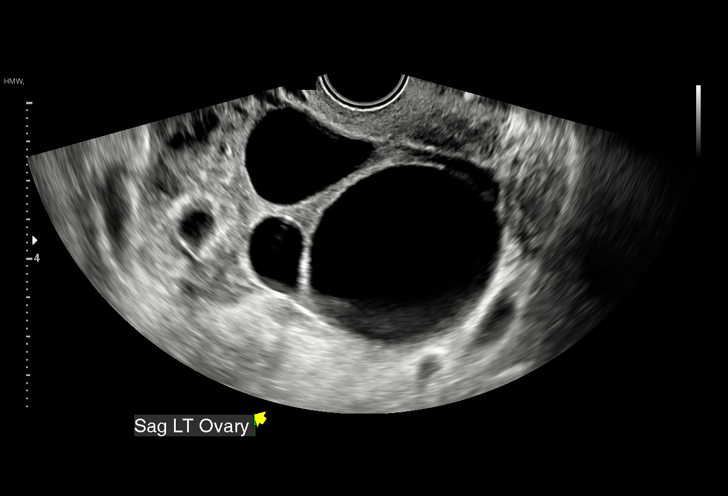
[im 18/40]
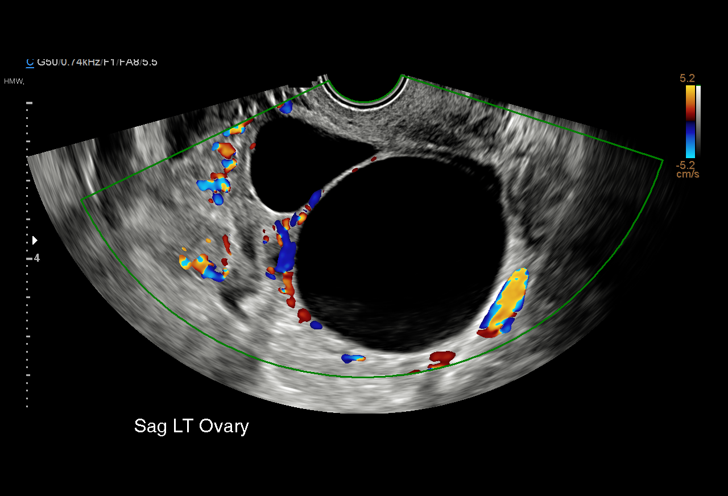
[im 21/40]
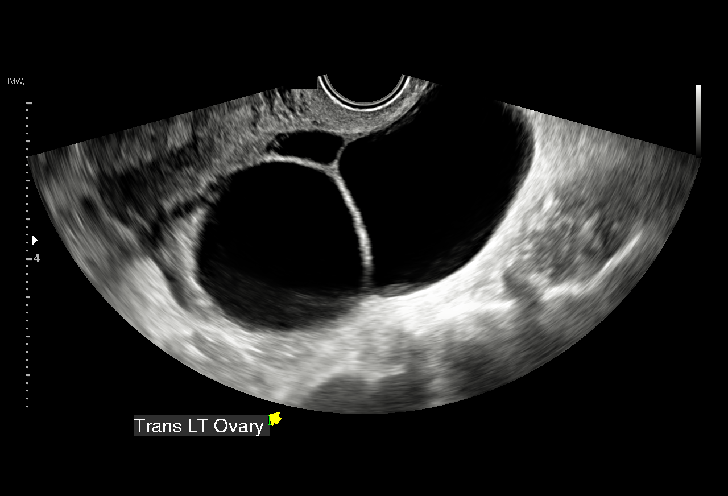
[im 22/40]
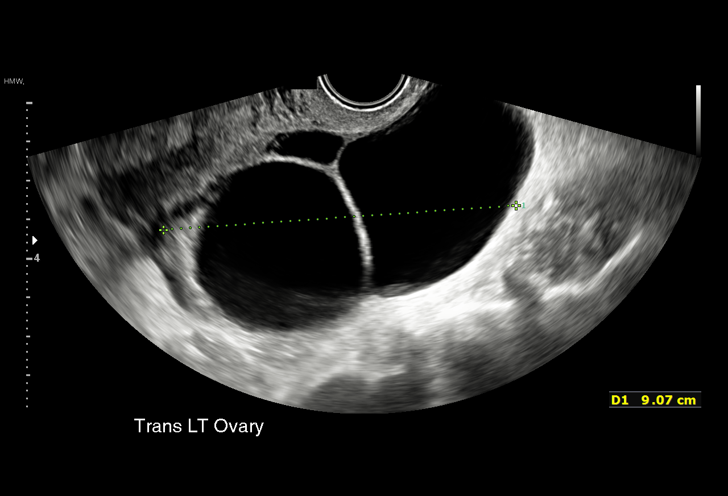
[im 25/40]
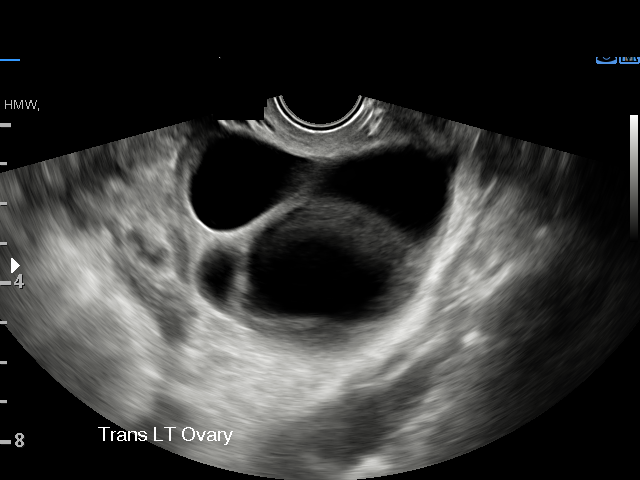
[im 28/40]
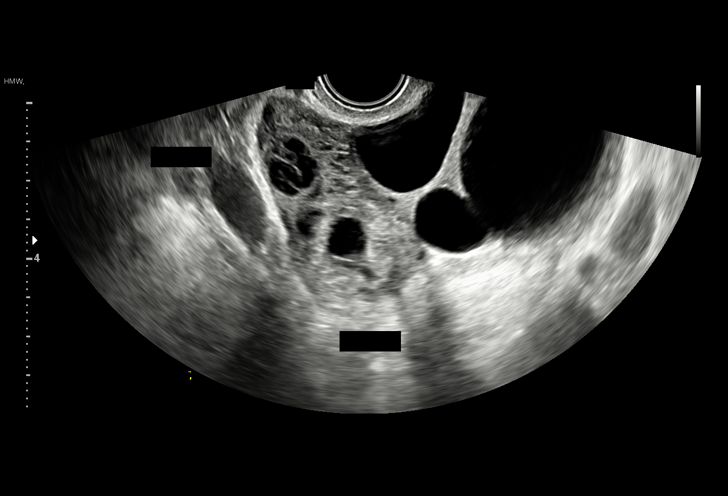
[im 31/40]
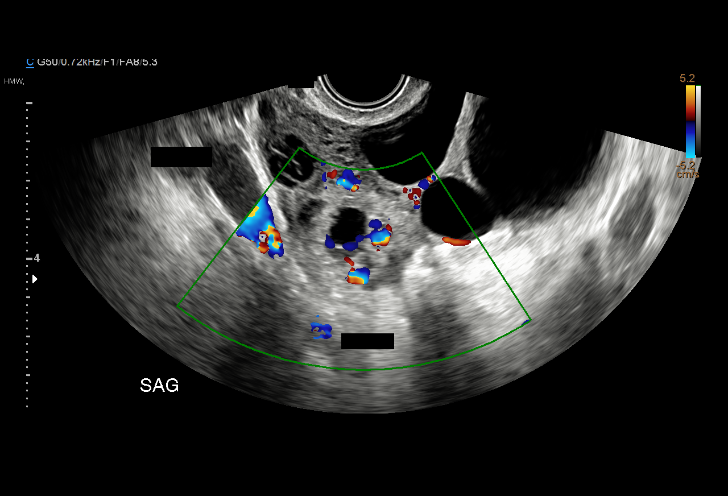
[im 34/40]
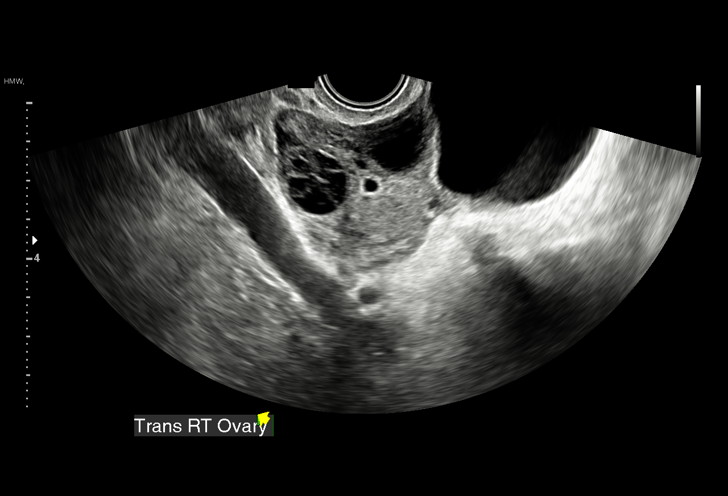
[im 37/40]
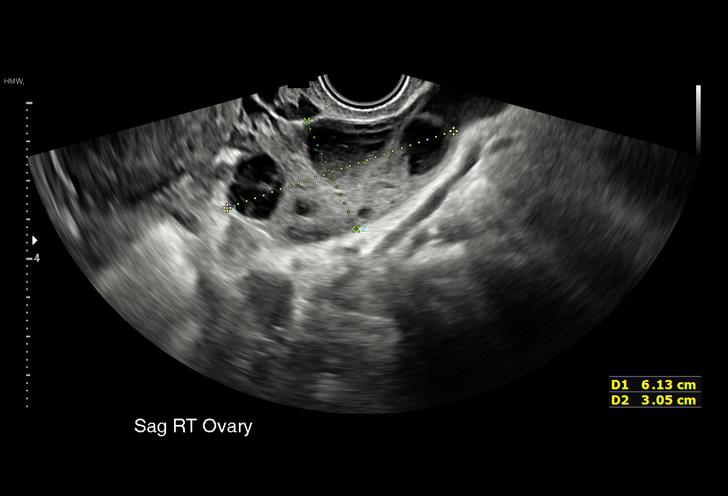
[im 40/40]
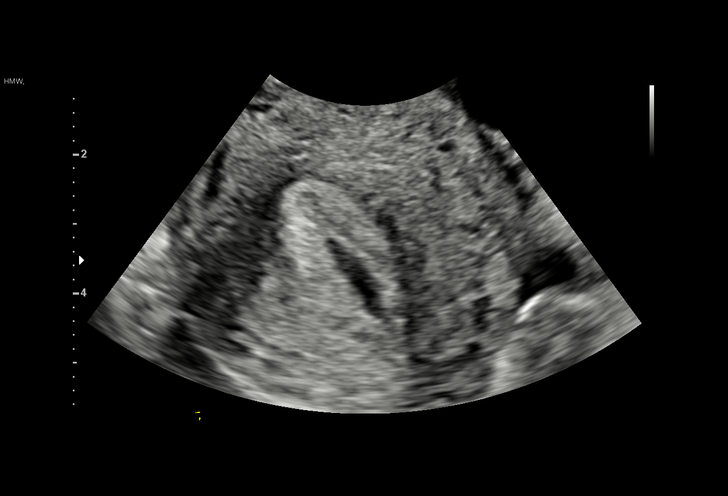

[15 of 28 positions shown; findings below may reference images not displayed]

FINDINGS: Intrauterine sac is again not visualized.

The ovaries are contiguous with a multi cystic appearance on the
left with individual locules showing simple characteristics and
dimensions of up to 5.7 cm. Peripheral ovarian blood flow is seen on
the left.

Follicles are present in the right ovary, 2 showing signs of prior
hemorrhage. There is a sac and ring structure in the right adnexa
which could be intra-ovarian or closely adjacent extra ovarian. No
internal fetal components is seen.

No pelvic fluid or hemorrhage is seen.
IMPRESSION: 1. No visible intrauterine pregnancy.
2. 3 cm ring like structure at the right adnexa which could be
intra-ovarian or ectopic pregnancy. No better candidate for ectopic
pregnancy in the remainder of the scan.
3. Negative for pelvic fluid.
4. Multi cystic enlargement the left ovary. If no outside comparison
imaging, recommend follow-up.

## 2021-09-17 ENCOUNTER — Ambulatory Visit: Payer: BC Managed Care – PPO | Attending: Cardiology | Admitting: Cardiology

## 2021-09-17 ENCOUNTER — Encounter: Payer: Self-pay | Admitting: Cardiology

## 2021-09-17 VITALS — BP 108/62 | HR 74 | Ht 66.0 in | Wt 222.6 lb

## 2021-09-17 DIAGNOSIS — I493 Ventricular premature depolarization: Secondary | ICD-10-CM

## 2021-09-17 NOTE — Progress Notes (Signed)
Electrophysiology Office Note   Date:  09/17/2021   ID:  [name], DOB 05/03/1980, MRN [id number]  PCP:  No primary care provider on file.  Cardiologist:  Patwardhan Primary Electrophysiologist:  Macee Venables [name], MD    Chief Complaint: PVC   History of Present Illness: [name] is a 41 y.o. female who is being seen today for the evaluation of PVC at the request of No ref. provider found. Presenting today for electrophysiology evaluation.  He has a history seen for PVCs with a PVC induced cardiomyopathy.  She had an attempted ablation, but PVCs have continued.  She is currently on mexiletine  Today, denies symptoms of palpitations, chest pain, shortness of breath, orthopnea, PND, lower extremity edema, claudication, dizziness, presyncope, syncope, bleeding, or neurologic sequela. The patient is tolerating medications without difficulties.  Since being seen she has done well.  She is able to exercise without issue.  She does state that she feels palpitations when she exercises, but does not notice them as much when she is at rest.  She is overall quite happy with her control.  She does not wish to make adjustments to her medications.   Past Medical History:  Diagnosis Date   Asthma    Cardiomyopathy (HCC)    induced by pvc's improved per 01-18-2019 echo in [facility] lov note 02-03-2019   Chlamydia    Past Surgical History:  Procedure Laterality Date   Cyst removal rt ear  yrs ago   LAPAROSCOPIC BILATERAL SALPINGECTOMY Bilateral 03/27/2020   Procedure: LAPAROSCOPIC BILATERAL SALPINGECTOMY, EVACUATION OF HEMOPERITONEUM;  Surgeon: Essie Hart, MD;  Location: [facility];  Service: Gynecology;  Laterality: Bilateral;   LAPAROSCOPIC OVARIAN CYSTECTOMY  03/27/2020   Procedure: LAPAROSCOPIC BILATERAL OVARIAN CYSTECTOMY;  Surgeon: Essie Hart, MD;  Location: [facility];  Service: Gynecology;;   LAPAROSCOPIC TUBAL LIGATION Bilateral 07/07/2019   Procedure: LAPAROSCOPIC TUBAL  LIGATION BY FULGURATION;  Surgeon: Hoover Browns, MD;  Location: [facility];  Service: Gynecology;  Laterality: Bilateral;   PVC ABLATION N/A 06/30/2018   Procedure: PVC ABLATION;  Surgeon: Regan Lemming, MD;  [facility]: [facility];  Service: Cardiovascular;  Laterality: N/A;     Current Outpatient Medications  Medication Sig Dispense Refill   albuterol (PROVENTIL HFA) 108 (90 BASE) MCG/ACT inhaler Inhale 2 puffs into the lungs every 6 (six) hours as needed for wheezing. For shortness of breath or wheezing 1 Inhaler 2   EPINEPHrine 0.3 mg/0.3 mL IJ SOAJ injection as directed.     ibuprofen (ADVIL) 800 MG tablet Take 1 tablet (800 mg total) by mouth every 8 (eight) hours as needed for moderate pain. 60 tablet 3   mexiletine (MEXITIL) 150 MG capsule Take 1 capsule (150 mg total) by mouth 2 (two) times daily. 180 capsule 3   Multiple Vitamins-Minerals (MULTIVITAMIN WITH MINERALS) tablet Take 1 tablet by mouth daily.     No current facility-administered medications for this visit.    Allergies:   Peanut (diagnostic) and Shellfish-derived products   Social History:  The patient  reports that she has never smoked. She has never been exposed to tobacco smoke. She has never used smokeless tobacco. She reports current alcohol use. She reports that she does not use drugs.   Family History:  The patient's family history includes Cancer in her mother and paternal grandmother; Diabetes in her paternal grandmother; Heart attack (age of onset: 62) in her father; Heart attack (age of onset: 50) in her maternal  grandmother; Hypertension in her brother, maternal grandmother, and mother.   ROS:  Please see the history of present illness.   Otherwise, review of systems is positive for none.   All other systems are reviewed and negative.   PHYSICAL EXAM: VS:  BP 108/62   Pulse 74   Ht 5\' 6"  (1.676 m)   Wt 222 lb 9.6 oz (101 kg)   LMP 09/07/2021   SpO2 99%   BMI 35.93 kg/m  , BMI  Body mass index is 35.93 kg/m. GEN: Well nourished, well developed, in no acute distress  HEENT: normal  Neck: no JVD, carotid bruits, or masses Cardiac: irregular; no murmurs, rubs, or gallops,no edema  Respiratory:  clear to auscultation bilaterally, normal work of breathing GI: soft, nontender, nondistended, + BS MS: no deformity or atrophy  Skin: warm and dry Neuro:  Strength and sensation are intact Psych: euthymic mood, full affect  EKG:  EKG is ordered today. Personal review of the ekg ordered shows sinus rhythm, PVCs  Recent Labs: No results found for requested labs within last 365 days.    Lipid Panel  No results found for: "CHOL", "TRIG", "HDL", "CHOLHDL", "VLDL", "LDLCALC", "LDLDIRECT"   Wt Readings from Last 3 Encounters:  09/17/21 222 lb 9.6 oz (101 kg)  03/14/21 224 lb 3.2 oz (101.7 kg)  09/19/20 228 lb (103.4 kg)      Other studies Reviewed: Additional studies/ records that were reviewed today include: TTE 03/24/21  Review of the above records today demonstrates:   1. Left ventricular ejection fraction, by estimation, is 50 to 55%. The  left ventricle has low normal function. The left ventricle has no regional  wall motion abnormalities. Left ventricular diastolic parameters are  consistent with Grade I diastolic  dysfunction (impaired relaxation).   2. Right ventricular systolic function is normal. The right ventricular  size is normal. There is normal pulmonary artery systolic pressure. The  estimated right ventricular systolic pressure is 25.3 mmHg.   3. Left atrial size was mildly dilated.   4. The mitral valve is abnormal. Trivial mitral valve regurgitation. No  evidence of mitral stenosis.   5. The aortic valve is normal in structure. Aortic valve regurgitation is  not visualized. No aortic stenosis is present.   6. The inferior vena cava is normal in size with greater than 50%  respiratory variability, suggesting right atrial pressure of 3 mmHg.    7. Cannot exclude PFO.   8. Rhythm strip during this exam demostrated frequent PVC's.    ASSESSMENT AND PLAN:  1.  PVCs: Post ablation attempt 06/30/2018.  PVCs were coming from inferior to the tricuspid valve.  PVCs were not fully ablated.  She is now on mexiletine 150 mg twice daily.  High risk medication monitoring for mexiletine via ECG today.  PVCs today.  Her ejection fraction is low normal.  She is minimally symptomatic.  We Elfa Wooton continue with current management.   Current medicines are reviewed at length with the patient today.   The patient does not have concerns regarding her medicines.  The following changes were made today: None  Labs/ tests ordered today include:  Orders Placed This Encounter  Procedures   EKG 12-Lead      Disposition:   FU 6 months  Signed, Krystyne Tewksbury 07/02/2018, MD  09/17/2021 4:20 PM     [facility] [address] [place] [phone] (office) [phone] (fax)

## 2022-02-26 ENCOUNTER — Other Ambulatory Visit: Payer: Self-pay | Admitting: Obstetrics & Gynecology

## 2022-02-26 DIAGNOSIS — Z1231 Encounter for screening mammogram for malignant neoplasm of breast: Secondary | ICD-10-CM

## 2022-04-01 NOTE — Progress Notes (Signed)
Cardiology Office Note Date:  04/06/2022  Patient ID:  [name], DOB 20-Oct-1980, MRN [id number] PCP:  Patient, No Pcp Per   Electrophysiologist: Dr. Curt Bears    Chief Complaint: planned visit  History of Present Illness: [name] is a 42 y.o. female with history of CM (suspect 2/2 PVCs), PVCs  She saw Dr. Curt Bears 09/17/21, noted some palpitations w/exercise, none otherwise, she was happy with her current management and PVC control, no changes were made.  TODAY She continues to feel well Regularly exercising and has much improved exertional capacity then prior to starting the mexiletine. Minimal awareness of any palpitations and only when laying quite/nighttime. 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 PVCs were not fully ablated  Mexiletine started June 2020   Past Medical History:  Diagnosis Date   Asthma    Cardiomyopathy ([place])    induced by pvc's improved per 01-18-2019 echo [facility] note 02-03-2019   Chlamydia     Past Surgical History:  Procedure Laterality Date   Cyst removal rt ear  yrs ago   LAPAROSCOPIC BILATERAL SALPINGECTOMY Bilateral 03/27/2020   Procedure: LAPAROSCOPIC BILATERAL SALPINGECTOMY, EVACUATION OF HEMOPERITONEUM;  Surgeon: Sanjuana Kava, MD;  Location: [facility];  Service: Gynecology;  Laterality: Bilateral;   LAPAROSCOPIC OVARIAN CYSTECTOMY  03/27/2020   Procedure: LAPAROSCOPIC BILATERAL OVARIAN CYSTECTOMY;  Surgeon: Sanjuana Kava, MD;  Location: [facility];  Service: Gynecology;;   LAPAROSCOPIC TUBAL LIGATION Bilateral 07/07/2019   Procedure: LAPAROSCOPIC TUBAL LIGATION BY FULGURATION;  Surgeon: Waymon Amato, MD;  Location: [facility];  Service: Gynecology;  Laterality: Bilateral;   PVC ABLATION N/A 06/30/2018   Procedure: PVC ABLATION;  Surgeon: Constance Haw, MD;  Location: [facility]  CV [facility];  Service: Cardiovascular;  Laterality: N/A;    Current Outpatient Medications  Medication Sig Dispense Refill   albuterol (PROVENTIL HFA) 108 (90 BASE) MCG/ACT inhaler Inhale 2 puffs into the lungs every 6 (six) hours as needed for wheezing. For shortness of breath or wheezing 1 Inhaler 2   EPINEPHrine 0.3 mg/0.3 mL IJ SOAJ injection as directed.     ibuprofen (ADVIL) 800 MG tablet Take 1 tablet (800 mg total) by mouth every 8 (eight) hours as needed for moderate pain. 60 tablet 3   mexiletine (MEXITIL) 150 MG capsule Take 1 capsule (150 mg total) by mouth 2 (two) times daily. 180 capsule 3   Multiple Vitamins-Minerals (MULTIVITAMIN WITH MINERALS) tablet Take 1 tablet by mouth daily.     No current facility-administered medications for this visit.    Allergies:   Peanut (diagnostic) and Shellfish-derived products   Social History:  The patient  reports that she has never smoked. She has never been exposed to tobacco smoke. She has never used smokeless tobacco. She reports current alcohol use. She reports that she does not use drugs.   Family History:  The patient's family history includes Cancer in her mother and paternal grandmother; Diabetes in her paternal grandmother; Heart attack (age of onset: 17) in her father; Heart attack (age of onset: 74) in her maternal grandmother; Hypertension in her brother, maternal grandmother, and mother.  ROS:  Please see the history of present illness.    All other systems are reviewed and otherwise negative.   PHYSICAL EXAM:  VS:  BP 114/72   Pulse 89  Ht 5\' 6"  (1.676 m)   Wt 229 lb 12.8 oz (104.2 kg)   LMP 03/22/2022   SpO2 99%   BMI 37.09 kg/m  BMI: Body mass index is 37.09 kg/m. Well nourished, well developed, in no acute distress HEENT: normocephalic, atraumatic Neck: no JVD, carotid bruits or masses Cardiac:  RRR; only 2 extrasystoles with prolonged auscultations, no significant murmurs, no rubs, or gallops Lungs:  CTA b/l, no  wheezing, rhonchi or rales Abd: soft, nontender MS: no deformity or atrophy Ext: no edema Skin: warm and dry, no rash Neuro:  No gross deficits appreciated Psych: euthymic mood, full affect     EKG:  Done today and reviewed by myself shows  SR 89bpm, PVCs, unchanged  TTE 03/24/21   1. Left ventricular ejection fraction, by estimation, is 50 to 55%. The  left ventricle has low normal function. The left ventricle has no regional  wall motion abnormalities. Left ventricular diastolic parameters are  consistent with Grade I diastolic  dysfunction (impaired relaxation).   2. Right ventricular systolic function is normal. The right ventricular  size is normal. There is normal pulmonary artery systolic pressure. The  estimated right ventricular systolic pressure is 99991111 mmHg.   3. Left atrial size was mildly dilated.   4. The mitral valve is abnormal. Trivial mitral valve regurgitation. No  evidence of mitral stenosis.   5. The aortic valve is normal in structure. Aortic valve regurgitation is  not visualized. No aortic stenosis is present.   6. The inferior vena cava is normal in size with greater than 50%  respiratory variability, suggesting right atrial pressure of 3 mmHg.   7. Cannot exclude PFO.   8. Rhythm strip during this exam demostrated frequent PVC's.    02/01/20: TTE Normal LV systolic function with visual EF 60-65%. Left ventricle cavity  is normal in size. Normal global wall motion. Normal diastolic filling  pattern, normal LAP.  Mild (Grade I) mitral regurgitation.  Mild tricuspid regurgitation.  Insignificant pericardial effusion.  Normal sinus rhythm with frequent premature ventricular contractions.  Compared to prior study dated 01/18/2019: LVEF improved from 50-55% to  60-65%. No other significant changes noted.    06/30/2018: EPS/ablation CONCLUSIONS:  1. Sinus rhythm upon presentation.  2.  High burden of PVCs with attempt at ablation in the right  ventricle 3.  Unsuccessful ablation of the PVCs 4.  No acute complications.  24 Hr Holter monitor 06/02/2018: Dominant rhythm sinus. Min HR 53 bpm. Max HR 81 bpm. Avg HR 75 bpm. >40,000 ventricular ectopy beats (47.6% ectopy burden) including PVC, couplets, ventricular bigeminy. No nonsustained/sustained ventricular tachycardia. Occasional supraventricular ectopy (<1%). No other arrhthymias.  Occasional episodes of chest tightness and shortness of breath reported.   03/01/2018: TTE Left ventricle cavity is normal in size. Mild decrease in global wall  motion. Visual EF is 40-45%. Wall motion and diastolic function assessment  limited due to frequent PVC's. Calculated EF 42%.  Right atrial cavity is mildly dilated.  Moderate (Grade II) mitral regurgitation.  Mild tricuspid regurgitation. Mild pulmonary hypertension. Estimated  pulmonary artery systolic pressure 35 mmHg.  Insignificant pericardial effusion.   Recent Labs: No results found for requested labs within last 365 days.  No results found for requested labs within last 365 days.   CrCl cannot be calculated (Patient's most recent lab result is older than the maximum 21 days allowed.).   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  The patient did not have any concerns regarding medicines.  Venetia Night, PA-C 04/06/2022 10:55 AM     [facility] [place] [address] [phone] (office)  [phone] (fax)

## 2022-04-06 ENCOUNTER — Encounter: Payer: Self-pay | Admitting: Physician Assistant

## 2022-04-06 ENCOUNTER — Ambulatory Visit: Payer: BC Managed Care – PPO | Attending: Physician Assistant | Admitting: Physician Assistant

## 2022-04-06 VITALS — BP 114/72 | HR 89 | Ht 66.0 in | Wt 229.8 lb

## 2022-04-06 DIAGNOSIS — I493 Ventricular premature depolarization: Secondary | ICD-10-CM

## 2022-04-06 DIAGNOSIS — I428 Other cardiomyopathies: Secondary | ICD-10-CM

## 2022-04-06 MED ORDER — MEXILETINE HCL 150 MG PO CAPS: 150.0000 mg | ORAL_CAPSULE | Freq: Two times a day (BID) | ORAL | 3 refills | Status: DC

## 2022-04-06 NOTE — Patient Instructions (Signed)
Medication Instructions:   Your physician recommends that you continue on your current medications as directed. Please refer to the Current Medication list given to you today.   *If you need a refill on your cardiac medications before your next appointment, please call your pharmacy*   Lab Work: NONE ORDERED  TODAY    If you have labs (blood work) drawn today and your tests are completely normal, you will receive your results only by: MyChart Message (if you have MyChart) OR A paper copy in the mail If you have any lab test that is abnormal or we need to change your treatment, we will call you to review the results.   Testing/Procedures: NONE ORDERED  TODAY    Follow-Up: At [facility], you and your health needs are our priority.  As part of our continuing mission to provide you with exceptional heart care, we have created designated Provider Care Teams.  These Care Teams include your primary Cardiologist (physician) and Advanced Practice Providers (APPs -  Physician Assistants and Nurse Practitioners) who all work together to provide you with the care you need, when you need it.  We recommend signing up for the patient portal called "MyChart".  Sign up information is provided on this After Visit Summary.  MyChart is used to connect with patients for Virtual Visits (Telemedicine).  Patients are able to view lab/test results, encounter notes, upcoming appointments, etc.  Non-urgent messages can be sent to your provider as well.   To learn more about what you can do with MyChart, go to [email or web address].    Your next appointment:   6 month(s)  Provider:   You may see Dr. Camnitz  or one of the following Advanced Practice Providers on your designated Care Team:   Renee Ursuy, PA-C   Other Instructions  

## 2022-04-08 ENCOUNTER — Ambulatory Visit: Payer: BC Managed Care – PPO

## 2022-05-12 ENCOUNTER — Ambulatory Visit: Payer: BC Managed Care – PPO

## 2022-10-07 ENCOUNTER — Encounter: Payer: Self-pay | Admitting: Cardiology

## 2022-10-07 ENCOUNTER — Ambulatory Visit: Payer: BC Managed Care – PPO | Attending: Cardiology | Admitting: Cardiology

## 2022-10-07 ENCOUNTER — Ambulatory Visit: Payer: BC Managed Care – PPO

## 2022-10-07 VITALS — BP 114/78 | HR 86 | Ht 66.0 in | Wt 204.2 lb

## 2022-10-07 DIAGNOSIS — I428 Other cardiomyopathies: Secondary | ICD-10-CM

## 2022-10-07 DIAGNOSIS — I493 Ventricular premature depolarization: Secondary | ICD-10-CM | POA: Diagnosis not present

## 2022-10-07 NOTE — Patient Instructions (Addendum)
Medication Instructions:  Your physician has recommended you make the following change in your medication:  STOP Mexiletine  *If you need a refill on your cardiac medications before your next appointment, please call your pharmacy*   Lab Work: None ordered   Testing/Procedures:                           ZIO XT- Long Term Monitor Instructions  Your physician has requested you wear a ZIO patch monitor for 14 days.  This is a single patch monitor. [employer] supplies one patch monitor per enrollment. Additional stickers are not available. Please do not apply patch if you will be having a Nuclear Stress Test,  Echocardiogram, Cardiac CT, MRI, or Chest Xray during the period you would be wearing the  monitor. The patch cannot be worn during these tests. You cannot remove and re-apply the  ZIO XT patch monitor.    Billing and Patient Assistance Program Information  We have supplied [employer] with any of your insurance information on file for billing purposes. [employer] offers a sliding scale Patient Assistance Program for patients that do not have  insurance, or whose insurance does not completely cover the cost of the ZIO monitor.  You must apply for the Patient Assistance Program to qualify for this discounted rate.  To apply, please call [employer] at [phone], select option 4, select option 2, ask to apply for  Patient Assistance Program. Meredeth Ide will ask your household income, and how many people  are in your household. They will quote your out-of-pocket cost based on that information.  [employer] will also be able to set up a 22-month, interest-free payment plan if needed.  Applying the monitor   Shave hair from upper left chest.  Hold abrader disc by orange tab. Rub abrader in 40 strokes over the upper left chest as  indicated in your monitor instructions.  Clean area with 4 enclosed alcohol pads. Let dry.  Apply patch as indicated in monitor instructions. Patch will be placed under  collarbone on left  side of chest with arrow pointing upward.  Rub patch adhesive wings for 2 minutes. Remove white label marked "1". Remove the white  label marked "2". Rub patch adhesive wings for 2 additional minutes.  While looking in a mirror, press and release button in center of patch. A small green light will  flash 3-4 times. This will be your only indicator that the monitor has been turned on.  Do not shower for the first 24 hours. You may shower after the first 24 hours.  Press the button if you feel a symptom. You will hear a small click. Record Date, Time and  Symptom in the Patient Logbook.  When you are ready to remove the patch, follow instructions on the last 2 pages of Patient  Logbook. Stick patch monitor onto the last page of Patient Logbook.  Place Patient Logbook in the blue and white box. Use locking tab on box and tape box closed  securely. The blue and white box has prepaid postage on it. Please place it in the mailbox as  soon as possible. Your physician should have your test results approximately 7 days after the  monitor has been mailed back to [facility].  Call [facility] Customer Care at [phone] if you have questions regarding  your ZIO XT patch monitor. Call them immediately if you see an orange light blinking on your  monitor.  If your monitor [place]  off in less than 4 days, contact our Monitor department at [phone].  If your monitor becomes loose or [place] off after 4 days call [employer] at [phone] for  suggestions on securing your monitor   Follow-Up: At [facility], you and your health needs are our priority.  As part of our continuing mission to provide you with exceptional heart care, we have created designated Provider Care Teams.  These Care Teams include your primary Cardiologist (physician) and Advanced Practice Providers (APPs -  Physician Assistants and Nurse Practitioners) who all work together to provide you with the care  you need, when you need it.  Your next appointment:   6 month(s)  The format for your next appointment:   In Person  Provider:   Loman Brooklyn, MD    Thank you for choosing [facility]!!   Dory Horn, RN [phone]

## 2022-10-07 NOTE — Progress Notes (Unsigned)
Applied a 14 day Zio XT monitor to patient in the office ?

## 2022-10-07 NOTE — Progress Notes (Signed)
Electrophysiology Office Note:   Date:  10/07/2022  ID:  [name], DOB 22-Oct-1980, MRN [id number]  Primary Cardiologist: None Electrophysiologist: Anyla Israelson [name], MD      History of Present Illness:   [name] is a 42 y.o. female with h/o nonischemic cardiomyopathy, PVCs seen today for routine electrophysiology followup.   Since last being seen in our clinic the patient reports doing she overall feels well.  She has no chest pain or shortness of breath.  She remains able to do all of her daily activities.  She is unaware of PVCs.  She would like to try being off of her mexiletine..  she denies chest pain, palpitations, dyspnea, PND, orthopnea, nausea, vomiting, dizziness, syncope, edema, weight gain, or early satiety.   Review of systems complete and found to be negative unless listed in HPI.   EP Information / Studies Reviewed:    EKG is ordered today. Personal review as below.  EKG Interpretation Date/Time:  Wednesday October 07 2022 14:05:38 EDT Ventricular Rate:  85 PR Interval:  156 QRS Duration:  90 QT Interval:  368 QTC Calculation: 437 R Axis:   71  Text Interpretation: Sinus rhythm with frequent Premature ventricular complexes Biatrial enlargement Possible Inferior infarct , age undetermined When compared with ECG of 25-Sep-2019 21:11, No acute changes Confirmed by [employer], Philicia Heyne ([id number]) on 10/07/2022 2:12:36 PM     Risk Assessment/Calculations:              Physical Exam:   VS:  BP 114/78 (BP Location: Left Arm, Patient Position: Sitting, Cuff Size: Large)   Pulse 86   Ht 5\' 6"  (1.676 m)   Wt 204 lb 3.2 oz (92.6 kg)   SpO2 96%   BMI 32.96 kg/m    Wt Readings from Last 3 Encounters:  10/07/22 204 lb 3.2 oz (92.6 kg)  04/06/22 229 lb 12.8 oz (104.2 kg)  09/17/21 222 lb 9.6 oz (101 kg)     GEN: Well nourished, well developed in no acute distress NECK: No JVD; No carotid bruits CARDIAC: Regular rate and rhythm with occasional ectopy, no  murmurs, rubs, gallops RESPIRATORY:  Clear to auscultation without rales, wheezing or rhonchi  ABDOMEN: Soft, non-tender, non-distended EXTREMITIES:  No edema; No deformity   ASSESSMENT AND PLAN:    1.  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  Continued to have PVCs and now on mexiletine.  Would like to try being off of her mexiletine.  In order to do this, we Cheyna Retana plan for a 2-week monitor after stopping her medication.  If she does continue to have PVCs and wants to try being off of the medication, we Mattison Stuckey repeat her echo in 6 months.  2.  Chronic systolic heart failure: Due to nonischemic cardiomyopathy.  Has had improved ejection fraction with PVC suppression.  Follow up with EP APP in 6 months  Signed, Aixa Corsello [name], MD

## 2022-10-12 ENCOUNTER — Telehealth: Payer: Self-pay | Admitting: Cardiology

## 2022-10-12 ENCOUNTER — Other Ambulatory Visit: Payer: Self-pay | Admitting: *Deleted

## 2022-10-12 ENCOUNTER — Ambulatory Visit: Payer: BC Managed Care – PPO | Attending: Cardiology

## 2022-10-12 DIAGNOSIS — I493 Ventricular premature depolarization: Secondary | ICD-10-CM | POA: Diagnosis not present

## 2022-10-12 NOTE — Telephone Encounter (Signed)
Patient is calling because she had a heart monitor put on last Wednesday. Patient stated the heart monitor fell off on Saturday. Patient stated when had fallen off, her chest was red and in hives from where the adhesive were. Please advise.

## 2022-10-12 NOTE — Progress Notes (Unsigned)
[employer] Body Guardian Mini EL long term holter serial # [id number] applied to patient using tincture of benzoin.  Patient given Acrylic, Hydrocolloid, Bridge with 68m repositionable electrodes to try. She can remove monitor if skin becomes irritated and select another placement , or take a day break before reapplying .  Patient to wear the monitor 14 days.

## 2022-10-12 NOTE — Telephone Encounter (Signed)
Patients 14 day ZIO XT fell off in less than 3 days.  Patient had an allergic reaction to the patch with redness and hives. Dr. Elberta Fortis would prefer a full 14 days of recordings. [employer] contacted to cancel charges on [id number] which was applied 10/07/22. A new order has been entered and patient coming in this afternoon to have a [employer] monitor applied with a couple sensitive skin alternatives.

## 2022-12-07 ENCOUNTER — Encounter: Payer: Self-pay | Admitting: Cardiology

## 2022-12-08 MED ORDER — MEXILETINE HCL 150 MG PO CAPS: 150.0000 mg | ORAL_CAPSULE | Freq: Two times a day (BID) | ORAL | 6 refills | Status: DC

## 2022-12-08 NOTE — Telephone Encounter (Signed)
Pt prefers to restart Mexiletine at this time.  Rx sent to requested pharmacy.  Will forward to scheduler to arrange follow up with Dr. Elberta Fortis in March 2025.   Cassie - you can reach patient easiest by mychart to schedule (she is fine with this method).

## 2023-03-30 ENCOUNTER — Ambulatory Visit: Payer: BC Managed Care – PPO | Attending: Cardiology | Admitting: Cardiology

## 2023-03-30 ENCOUNTER — Encounter: Payer: Self-pay | Admitting: Cardiology

## 2023-03-30 VITALS — BP 114/80 | HR 69 | Ht 66.0 in | Wt 209.0 lb

## 2023-03-30 DIAGNOSIS — I428 Other cardiomyopathies: Secondary | ICD-10-CM

## 2023-03-30 DIAGNOSIS — I493 Ventricular premature depolarization: Secondary | ICD-10-CM

## 2023-03-30 DIAGNOSIS — I4729 Other ventricular tachycardia: Secondary | ICD-10-CM

## 2023-03-30 NOTE — Progress Notes (Signed)
  Electrophysiology Office Note:   Date:  03/30/2023  ID:  [name], DOB November 04, 1980, MRN [id number]  Primary Cardiologist: None Primary Heart Failure: None Electrophysiologist: Nic Lampe [name], MD      History of Present Illness:   [name] is a 43 y.o. female with h/o nonischemic cardiomyopathy, PVCs seen today for routine electrophysiology followup.   Since last being seen in our clinic the patient reports doing well.  She has no awareness of PVCs.  She wore a cardiac monitor that showed an elevated PAC burden, but further review of the monitor, I feel that these are PVCs.  She has no acute complaints.  She continues to be active and is able to do her daily activities without restriction.  she denies chest pain, palpitations, dyspnea, PND, orthopnea, nausea, vomiting, dizziness, syncope, edema, weight gain, or early satiety.   Review of systems complete and found to be negative unless listed in HPI.   EP Information / Studies Reviewed:    EKG is ordered today. Personal review as below.  EKG Interpretation Date/Time:  Tuesday March 30 2023 08:08:53 EDT Ventricular Rate:  69 PR Interval:  154 QRS Duration:  84 QT Interval:  380 QTC Calculation: 407 R Axis:   109  Text Interpretation: Sinus rhythm with frequent Premature ventricular complexes Possible Left atrial enlargement Rightward axis Cannot rule out Anterior infarct , age undetermined When compared with ECG of 07-Oct-2022 14:05, No significant change since last tracing Confirmed by [name], [name] ([id number]) on 03/30/2023 8:14:36 AM     Risk Assessment/Calculations:             Physical Exam:   VS:  BP 114/80 (BP Location: Left Arm, Patient Position: Sitting, Cuff Size: Large)   Pulse 69   Ht 5\' 6"  (1.676 m)   Wt 209 lb (94.8 kg)   BMI 33.73 kg/m    Wt Readings from Last 3 Encounters:  03/30/23 209 lb (94.8 kg)  10/07/22 204 lb 3.2 oz (92.6 kg)  04/06/22 229 lb 12.8 oz (104.2 kg)     GEN: Well  nourished, well developed in no acute distress NECK: No JVD; No carotid bruits CARDIAC: Regular rate and rhythm, no murmurs, rubs, gallops RESPIRATORY:  Clear to auscultation without rales, wheezing or rhonchi  ABDOMEN: Soft, non-tender, non-distended EXTREMITIES:  No edema; No deformity   ASSESSMENT AND PLAN:    1.  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  Currently on mexiletine 300 mg twice daily.  In review of her cardiac monitor, I do not see a high burden of PACs.  I think that all her episodes are due to PVCs.  Fortunately she is minimally symptomatic.  Her ejection fraction is low normal.  For now, we [name] continue to monitor.  If her ejection fraction becomes reduced again, she [name] need adjustment in therapy.  2.  Chronic systolic heart failure: Ejection fraction is improved to 50 to 55%.  Continue monitoring  Follow up with EP APP in 6 months  Signed, Faye Strohman [name], MD

## 2023-03-30 NOTE — Patient Instructions (Signed)
 Medication Instructions:  Your physician recommends that you continue on your current medications as directed. Please refer to the Current Medication list given to you today.  *If you need a refill on your cardiac medications before your next appointment, please call your pharmacy*   Lab Work: None ordered   Testing/Procedures: None ordered   Follow-Up: At [facility], you and your health needs are our priority.  As part of our continuing mission to provide you with exceptional heart care, we have created designated Provider Care Teams.  These Care Teams include your primary Cardiologist (physician) and Advanced Practice Providers (APPs -  Physician Assistants and Nurse Practitioners) who all work together to provide you with the care you need, when you need it.  Your next appointment:   6 month(s)  The format for your next appointment:   In Person  Provider:   Francis Dowse, PA-C    Thank you for choosing [facility]!!   Dory Horn, RN [phone]  Other Instructions  Heart-Healthy Eating Plan Many factors influence your heart health, including eating and exercise habits. Heart health is also called coronary health. Coronary risk increases with abnormal blood fat (lipid) levels. A heart-healthy eating plan includes limiting unhealthy fats, increasing healthy fats, limiting salt (sodium) intake, and making other diet and lifestyle changes. What is my plan? Your health care provider may recommend that: You limit your fat intake to _________% or less of your total calories each day. You limit your saturated fat intake to _________% or less of your total calories each day. You limit the amount of cholesterol in your diet to less than _________ mg per day. You limit the amount of sodium in your diet to less than _________ mg per day. What are tips for following this plan? Cooking Cook foods using methods other than frying. Baking, boiling, grilling, and broiling are  all good options. Other ways to reduce fat include: Removing the skin from poultry. Removing all visible fats from meats. Steaming vegetables in water or broth. Meal planning  At meals, imagine dividing your plate into fourths: Fill one-half of your plate with vegetables and green salads. Fill one-fourth of your plate with whole grains. Fill one-fourth of your plate with lean protein foods. Eat 2-4 cups of vegetables per day. One cup of vegetables equals 1 cup (91 g) broccoli or cauliflower florets, 2 medium carrots, 1 large bell pepper, 1 large sweet potato, 1 large tomato, 1 medium white potato, 2 cups (150 g) raw leafy greens. Eat 1-2 cups of fruit per day. One cup of fruit equals 1 small apple, 1 large banana, 1 cup (237 g) mixed fruit, 1 large orange,  cup (82 g) dried fruit, 1 cup (240 mL) 100% fruit juice. Eat more foods that contain soluble fiber. Examples include apples, broccoli, carrots, beans, peas, and barley. Aim to get 25-30 g of fiber per day. Increase your consumption of legumes, nuts, and seeds to 4-5 servings per week. One serving of dried beans or legumes equals  cup (90 g) cooked, 1 serving of nuts is  oz (12 almonds, 24 pistachios, or 7 walnut halves), and 1 serving of seeds equals  oz (8 g). Fats Choose healthy fats more often. Choose monounsaturated and polyunsaturated fats, such as olive and canola oils, avocado oil, flaxseeds, walnuts, almonds, and seeds. Eat more omega-3 fats. Choose salmon, mackerel, sardines, tuna, flaxseed oil, and ground flaxseeds. Aim to eat fish at least 2 times each week. Check food labels carefully to identify  foods with trans fats or high amounts of saturated fat. Limit saturated fats. These are found in animal products, such as meats, butter, and cream. Plant sources of saturated fats include palm oil, palm kernel oil, and coconut oil. Avoid foods with partially hydrogenated oils in them. These contain trans fats. Examples are stick  margarine, some tub margarines, cookies, crackers, and other baked goods. Avoid fried foods. General information Eat more home-cooked food and less restaurant, buffet, and fast food. Limit or avoid alcohol. Limit foods that are high in added sugar and simple starches such as foods made using white refined flour (white breads, pastries, sweets). Lose weight if you are overweight. Losing just 5-10% of your body weight can help your overall health and prevent diseases such as diabetes and heart disease. Monitor your sodium intake, especially if you have high blood pressure. Talk with your health care provider about your sodium intake. Try to incorporate more vegetarian meals weekly. What foods should I eat? Fruits All fresh, canned (in natural juice), or frozen fruits. Vegetables Fresh or frozen vegetables (raw, steamed, roasted, or grilled). Green salads. Grains Most grains. Choose whole wheat and whole grains most of the time. Rice and pasta, including brown rice and pastas made with whole wheat. Meats and other proteins Lean, well-trimmed beef, veal, pork, and lamb. Chicken and [place] without skin. All fish and shellfish. Wild duck, rabbit, pheasant, and venison. Egg whites or low-cholesterol egg substitutes. Dried beans, peas, lentils, and tofu. Seeds and most nuts. Dairy Low-fat or nonfat cheeses, including ricotta and mozzarella. Skim or 1% milk (liquid, powdered, or evaporated). Buttermilk made with low-fat milk. Nonfat or low-fat yogurt. Fats and oils Non-hydrogenated (trans-free) margarines. Vegetable oils, including soybean, sesame, sunflower, olive, avocado, peanut, safflower, corn, canola, and cottonseed. Salad dressings or mayonnaise made with a vegetable oil. Beverages Water (mineral or sparkling). Coffee and tea. Unsweetened ice tea. Diet beverages. Sweets and desserts Sherbet, gelatin, and fruit ice. Small amounts of dark chocolate. Limit all sweets and desserts. Seasonings  and condiments All seasonings and condiments. The items listed above may not be a complete list of foods and beverages you can eat. Contact a dietitian for more options. What foods should I avoid? Fruits Canned fruit in heavy syrup. Fruit in cream or butter sauce. Fried fruit. Limit coconut. Vegetables Vegetables cooked in cheese, cream, or butter sauce. Fried vegetables. Grains Breads made with saturated or trans fats, oils, or whole milk. Croissants. Sweet rolls. Donuts. High-fat crackers, such as cheese crackers and chips. Meats and other proteins Fatty meats, such as hot dogs, ribs, sausage, bacon, rib-eye roast or steak. High-fat deli meats, such as salami and bologna. Caviar. Domestic duck and goose. Organ meats, such as liver. Dairy Cream, sour cream, cream cheese, and creamed cottage cheese. Whole-milk cheeses. Whole or 2% milk (liquid, evaporated, or condensed). Whole buttermilk. Cream sauce or high-fat cheese sauce. Whole-milk yogurt. Fats and oils Meat fat, or shortening. Cocoa butter, hydrogenated oils, palm oil, coconut oil, palm kernel oil. Solid fats and shortenings, including bacon fat, salt pork, lard, and butter. Nondairy cream substitutes. Salad dressings with cheese or sour cream. Beverages Regular sodas and any drinks with added sugar. Sweets and desserts Frosting. Pudding. Cookies. Cakes. Pies. Milk chocolate or white chocolate. Buttered syrups. Full-fat ice cream or ice cream drinks. The items listed above may not be a complete list of foods and beverages to avoid. Contact a dietitian for more information. Summary Heart-healthy meal planning includes limiting unhealthy fats, increasing healthy fats, limiting  salt (sodium) intake and making other diet and lifestyle changes. Lose weight if you are overweight. Losing just 5-10% of your body weight can help your overall health and prevent diseases such as diabetes and heart disease. Focus on eating a balance of foods,  including fruits and vegetables, low-fat or nonfat dairy, lean protein, nuts and legumes, whole grains, and heart-healthy oils and fats. This information is not intended to replace advice given to you by your health care provider. Make sure you discuss any questions you have with your health care provider. Document Revised: 02/03/2021 Document Reviewed: 02/03/2021 Elsevier Patient Education  2024 [employer].

## 2023-04-28 ENCOUNTER — Other Ambulatory Visit: Payer: Self-pay | Admitting: Family Medicine

## 2023-04-28 ENCOUNTER — Ambulatory Visit
Admission: RE | Admit: 2023-04-28 | Discharge: 2023-04-28 | Disposition: A | Discharge status: Home / Self Care | Source: Ambulatory Visit | Attending: Family Medicine

## 2023-04-28 DIAGNOSIS — Z1231 Encounter for screening mammogram for malignant neoplasm of breast: Secondary | ICD-10-CM

## 2023-08-27 ENCOUNTER — Other Ambulatory Visit: Payer: Self-pay | Admitting: Medical Genetics

## 2023-09-16 ENCOUNTER — Encounter: Payer: Self-pay | Admitting: Physician Assistant

## 2023-10-12 ENCOUNTER — Other Ambulatory Visit (HOSPITAL_COMMUNITY)

## 2023-10-29 ENCOUNTER — Other Ambulatory Visit (HOSPITAL_COMMUNITY)
Admission: RE | Admit: 2023-10-29 | Discharge: 2023-10-29 | Disposition: A | Payer: Self-pay | Source: Ambulatory Visit | Attending: Medical Genetics | Admitting: Medical Genetics

## 2023-11-08 LAB — GENECONNECT MOLECULAR SCREEN: Genetic Analysis Overall Interpretation: NEGATIVE

## 2023-12-17 ENCOUNTER — Other Ambulatory Visit: Payer: Self-pay | Admitting: Cardiology
